# Patient Record
Sex: Male | Born: 1935 | Race: White | Hispanic: No | Marital: Single | Smoking: Never smoker
Health system: Southern US, Community
[De-identification: ages and names within clinical notes are randomized; demographics above are authoritative.]

## PROBLEM LIST (undated history)

## (undated) DIAGNOSIS — J984 Other disorders of lung: Secondary | ICD-10-CM

## (undated) DIAGNOSIS — T17908A Unspecified foreign body in respiratory tract, part unspecified causing other injury, initial encounter: Secondary | ICD-10-CM

## (undated) DIAGNOSIS — I272 Pulmonary hypertension, unspecified: Secondary | ICD-10-CM

## (undated) DIAGNOSIS — R569 Unspecified convulsions: Secondary | ICD-10-CM

## (undated) DIAGNOSIS — D649 Anemia, unspecified: Secondary | ICD-10-CM

## (undated) DIAGNOSIS — E870 Hyperosmolality and hypernatremia: Secondary | ICD-10-CM

## (undated) DIAGNOSIS — I509 Heart failure, unspecified: Secondary | ICD-10-CM

## (undated) DIAGNOSIS — W19XXXA Unspecified fall, initial encounter: Secondary | ICD-10-CM

## (undated) DIAGNOSIS — IMO0001 Reserved for inherently not codable concepts without codable children: Secondary | ICD-10-CM

## (undated) DIAGNOSIS — IMO0002 Reserved for concepts with insufficient information to code with codable children: Secondary | ICD-10-CM

## (undated) DIAGNOSIS — R339 Retention of urine, unspecified: Secondary | ICD-10-CM

## (undated) DIAGNOSIS — S27309A Unspecified injury of lung, unspecified, initial encounter: Secondary | ICD-10-CM

## (undated) DIAGNOSIS — J189 Pneumonia, unspecified organism: Secondary | ICD-10-CM

## (undated) HISTORY — DX: Reserved for inherently not codable concepts without codable children: IMO0001

## (undated) HISTORY — DX: Reserved for concepts with insufficient information to code with codable children: IMO0002

---

## 1997-12-31 ENCOUNTER — Encounter: Admission: RE | Admit: 1997-12-31 | Discharge: 1997-12-31 | Payer: Self-pay | Admitting: Family Medicine

## 1998-01-20 ENCOUNTER — Encounter: Admission: RE | Admit: 1998-01-20 | Discharge: 1998-01-20 | Payer: Self-pay | Admitting: Family Medicine

## 2000-08-16 ENCOUNTER — Encounter: Admission: RE | Admit: 2000-08-16 | Discharge: 2000-08-16 | Payer: Self-pay | Admitting: Family Medicine

## 2000-09-11 ENCOUNTER — Encounter: Admission: RE | Admit: 2000-09-11 | Discharge: 2000-09-11 | Payer: Self-pay | Admitting: Family Medicine

## 2000-10-09 ENCOUNTER — Encounter: Admission: RE | Admit: 2000-10-09 | Discharge: 2000-10-09 | Payer: Self-pay | Admitting: Family Medicine

## 2000-10-31 ENCOUNTER — Encounter: Admission: RE | Admit: 2000-10-31 | Discharge: 2000-10-31 | Payer: Self-pay | Admitting: Family Medicine

## 2000-11-07 ENCOUNTER — Encounter: Admission: RE | Admit: 2000-11-07 | Discharge: 2000-11-07 | Payer: Self-pay | Admitting: Family Medicine

## 2000-11-21 ENCOUNTER — Encounter: Admission: RE | Admit: 2000-11-21 | Discharge: 2000-11-21 | Payer: Self-pay | Admitting: Family Medicine

## 2000-11-22 ENCOUNTER — Encounter: Admission: RE | Admit: 2000-11-22 | Discharge: 2000-11-22 | Payer: Self-pay | Admitting: Family Medicine

## 2000-12-09 ENCOUNTER — Encounter: Admission: RE | Admit: 2000-12-09 | Discharge: 2000-12-09 | Payer: Self-pay | Admitting: Family Medicine

## 2000-12-27 ENCOUNTER — Encounter: Admission: RE | Admit: 2000-12-27 | Discharge: 2000-12-27 | Payer: Self-pay | Admitting: Family Medicine

## 2001-01-01 ENCOUNTER — Encounter: Admission: RE | Admit: 2001-01-01 | Discharge: 2001-01-01 | Payer: Self-pay | Admitting: Family Medicine

## 2001-08-25 ENCOUNTER — Encounter: Admission: RE | Admit: 2001-08-25 | Discharge: 2001-08-25 | Payer: Self-pay | Admitting: Sports Medicine

## 2001-11-05 ENCOUNTER — Encounter: Admission: RE | Admit: 2001-11-05 | Discharge: 2001-11-05 | Payer: Self-pay | Admitting: Family Medicine

## 2001-11-20 ENCOUNTER — Encounter: Admission: RE | Admit: 2001-11-20 | Discharge: 2001-11-20 | Payer: Self-pay | Admitting: Family Medicine

## 2001-12-04 ENCOUNTER — Encounter: Admission: RE | Admit: 2001-12-04 | Discharge: 2001-12-04 | Payer: Self-pay | Admitting: Family Medicine

## 2001-12-11 ENCOUNTER — Encounter: Admission: RE | Admit: 2001-12-11 | Discharge: 2001-12-11 | Payer: Self-pay | Admitting: Family Medicine

## 2002-01-12 ENCOUNTER — Encounter: Admission: RE | Admit: 2002-01-12 | Discharge: 2002-01-12 | Payer: Self-pay | Admitting: Family Medicine

## 2002-02-04 ENCOUNTER — Encounter: Admission: RE | Admit: 2002-02-04 | Discharge: 2002-02-04 | Payer: Self-pay | Admitting: Family Medicine

## 2002-03-09 ENCOUNTER — Encounter: Admission: RE | Admit: 2002-03-09 | Discharge: 2002-03-09 | Payer: Self-pay | Admitting: Family Medicine

## 2002-03-20 ENCOUNTER — Encounter: Admission: RE | Admit: 2002-03-20 | Discharge: 2002-03-20 | Payer: Self-pay | Admitting: Family Medicine

## 2002-04-29 ENCOUNTER — Encounter: Admission: RE | Admit: 2002-04-29 | Discharge: 2002-04-29 | Payer: Self-pay | Admitting: Family Medicine

## 2002-07-13 ENCOUNTER — Encounter: Admission: RE | Admit: 2002-07-13 | Discharge: 2002-07-13 | Payer: Self-pay | Admitting: Family Medicine

## 2003-02-10 ENCOUNTER — Encounter: Admission: RE | Admit: 2003-02-10 | Discharge: 2003-02-10 | Payer: Self-pay | Admitting: Sports Medicine

## 2003-04-26 ENCOUNTER — Encounter: Admission: RE | Admit: 2003-04-26 | Discharge: 2003-04-26 | Payer: Self-pay | Admitting: Family Medicine

## 2003-05-26 ENCOUNTER — Encounter: Admission: RE | Admit: 2003-05-26 | Discharge: 2003-05-26 | Payer: Self-pay | Admitting: Family Medicine

## 2003-06-09 ENCOUNTER — Encounter: Admission: RE | Admit: 2003-06-09 | Discharge: 2003-06-09 | Payer: Self-pay | Admitting: Family Medicine

## 2003-06-11 ENCOUNTER — Encounter: Admission: RE | Admit: 2003-06-11 | Discharge: 2003-06-11 | Payer: Self-pay | Admitting: Family Medicine

## 2003-06-24 ENCOUNTER — Encounter: Admission: RE | Admit: 2003-06-24 | Discharge: 2003-06-24 | Payer: Self-pay | Admitting: Family Medicine

## 2003-10-14 ENCOUNTER — Encounter: Admission: RE | Admit: 2003-10-14 | Discharge: 2003-10-14 | Payer: Self-pay | Admitting: Family Medicine

## 2003-10-14 ENCOUNTER — Encounter: Admission: RE | Admit: 2003-10-14 | Discharge: 2003-10-14 | Payer: Self-pay | Admitting: Sports Medicine

## 2003-11-17 ENCOUNTER — Ambulatory Visit: Payer: Self-pay | Admitting: Sports Medicine

## 2003-12-02 ENCOUNTER — Encounter: Admission: RE | Admit: 2003-12-02 | Discharge: 2003-12-29 | Payer: Self-pay | Admitting: Sports Medicine

## 2004-06-01 ENCOUNTER — Inpatient Hospital Stay (HOSPITAL_COMMUNITY): Admission: AD | Admit: 2004-06-01 | Discharge: 2004-06-09 | Payer: Self-pay | Admitting: Family Medicine

## 2004-06-01 ENCOUNTER — Ambulatory Visit: Payer: Self-pay | Admitting: Family Medicine

## 2004-06-02 ENCOUNTER — Encounter: Payer: Self-pay | Admitting: Cardiology

## 2004-06-02 ENCOUNTER — Ambulatory Visit: Payer: Self-pay | Admitting: Cardiology

## 2004-06-29 ENCOUNTER — Ambulatory Visit: Payer: Self-pay | Admitting: Family Medicine

## 2004-08-10 ENCOUNTER — Ambulatory Visit: Payer: Self-pay | Admitting: Family Medicine

## 2004-09-11 ENCOUNTER — Ambulatory Visit: Payer: Self-pay | Admitting: Family Medicine

## 2004-11-15 ENCOUNTER — Ambulatory Visit: Payer: Self-pay | Admitting: Family Medicine

## 2005-06-13 ENCOUNTER — Ambulatory Visit: Payer: Self-pay | Admitting: Family Medicine

## 2005-07-17 ENCOUNTER — Ambulatory Visit: Payer: Self-pay | Admitting: Sports Medicine

## 2006-02-28 ENCOUNTER — Encounter: Payer: Self-pay | Admitting: Family Medicine

## 2006-02-28 ENCOUNTER — Ambulatory Visit: Payer: Self-pay | Admitting: Sports Medicine

## 2006-02-28 LAB — CONVERTED CEMR LAB
ALT: 13 units/L (ref 0–53)
AST: 21 units/L (ref 0–37)
Alkaline Phosphatase: 89 units/L (ref 39–117)
BUN: 16 mg/dL (ref 6–23)
CO2: 26 meq/L (ref 19–32)
Calcium: 8.5 mg/dL (ref 8.4–10.5)
Chloride: 99 meq/L (ref 96–112)
Creatinine, Ser: 0.57 mg/dL (ref 0.40–1.50)
Potassium: 5.1 meq/L (ref 3.5–5.3)
Sodium: 133 meq/L — ABNORMAL LOW (ref 135–145)
Total Bilirubin: 0.3 mg/dL (ref 0.3–1.2)

## 2006-04-04 ENCOUNTER — Ambulatory Visit: Payer: Self-pay | Admitting: Sports Medicine

## 2006-04-18 DIAGNOSIS — I10 Essential (primary) hypertension: Secondary | ICD-10-CM | POA: Insufficient documentation

## 2006-04-18 DIAGNOSIS — D509 Iron deficiency anemia, unspecified: Secondary | ICD-10-CM | POA: Insufficient documentation

## 2006-04-18 DIAGNOSIS — N401 Enlarged prostate with lower urinary tract symptoms: Secondary | ICD-10-CM | POA: Insufficient documentation

## 2006-05-07 ENCOUNTER — Ambulatory Visit: Payer: Self-pay | Admitting: Family Medicine

## 2006-05-07 ENCOUNTER — Ambulatory Visit: Payer: Self-pay | Admitting: Pulmonary Disease

## 2006-05-07 ENCOUNTER — Inpatient Hospital Stay (HOSPITAL_COMMUNITY): Admission: EM | Admit: 2006-05-07 | Discharge: 2006-05-22 | Payer: Self-pay | Admitting: Emergency Medicine

## 2006-05-08 ENCOUNTER — Encounter: Payer: Self-pay | Admitting: Cardiovascular Disease

## 2006-10-04 ENCOUNTER — Telehealth (INDEPENDENT_AMBULATORY_CARE_PROVIDER_SITE_OTHER): Payer: Self-pay | Admitting: *Deleted

## 2006-10-14 ENCOUNTER — Telehealth: Payer: Self-pay | Admitting: *Deleted

## 2006-10-23 ENCOUNTER — Ambulatory Visit: Payer: Self-pay | Admitting: Family Medicine

## 2006-10-23 ENCOUNTER — Encounter (INDEPENDENT_AMBULATORY_CARE_PROVIDER_SITE_OTHER): Payer: Self-pay | Admitting: *Deleted

## 2006-10-23 DIAGNOSIS — E039 Hypothyroidism, unspecified: Secondary | ICD-10-CM | POA: Insufficient documentation

## 2006-10-23 DIAGNOSIS — M810 Age-related osteoporosis without current pathological fracture: Secondary | ICD-10-CM | POA: Insufficient documentation

## 2006-10-29 LAB — CONVERTED CEMR LAB
BUN: 22 mg/dL (ref 6–23)
Creatinine, Ser: 0.68 mg/dL (ref 0.40–1.50)
Glucose, Bld: 93 mg/dL (ref 70–99)
MCHC: 32 g/dL (ref 30.0–36.0)
MCV: 87.5 fL (ref 78.0–100.0)
Monocytes Relative: 8 % (ref 3–11)
Neutro Abs: 4 10*3/uL (ref 1.7–7.7)
TSH: 3.963 microintl units/mL (ref 0.350–5.50)
WBC: 6.4 10*3/uL (ref 4.0–10.5)

## 2006-11-01 ENCOUNTER — Encounter (INDEPENDENT_AMBULATORY_CARE_PROVIDER_SITE_OTHER): Payer: Self-pay | Admitting: *Deleted

## 2006-12-17 ENCOUNTER — Encounter (INDEPENDENT_AMBULATORY_CARE_PROVIDER_SITE_OTHER): Payer: Self-pay | Admitting: *Deleted

## 2007-01-07 ENCOUNTER — Ambulatory Visit: Payer: Self-pay | Admitting: Family Medicine

## 2007-01-07 ENCOUNTER — Encounter: Payer: Self-pay | Admitting: *Deleted

## 2007-01-07 ENCOUNTER — Encounter (INDEPENDENT_AMBULATORY_CARE_PROVIDER_SITE_OTHER): Payer: Self-pay | Admitting: *Deleted

## 2007-01-07 LAB — CONVERTED CEMR LAB
BUN: 15 mg/dL (ref 6–23)
Bilirubin Urine: NEGATIVE
Calcium: 8.6 mg/dL (ref 8.4–10.5)
Creatinine, Ser: 0.63 mg/dL (ref 0.40–1.50)
Eosinophils Absolute: 0.3 10*3/uL (ref 0.2–0.7)
Eosinophils Relative: 4 % (ref 0–5)
Glucose, Urine, Semiquant: NEGATIVE
Hemoglobin: 11.7 g/dL — ABNORMAL LOW (ref 13.0–17.0)
Lymphocytes Relative: 29 % (ref 12–46)
Lymphs Abs: 2.1 10*3/uL (ref 0.7–4.0)
MCHC: 31.5 g/dL (ref 30.0–36.0)
Monocytes Absolute: 0.5 10*3/uL (ref 0.1–1.0)
Neutro Abs: 4.3 10*3/uL (ref 1.7–7.7)
Neutrophils Relative %: 60 % (ref 43–77)
Nitrite: POSITIVE
Platelets: 264 10*3/uL (ref 150–400)
Protein, U semiquant: NEGATIVE
RDW: 14 % (ref 11.5–15.5)
Specific Gravity, Urine: 1.015
Urobilinogen, UA: 0.2

## 2007-01-13 ENCOUNTER — Encounter (INDEPENDENT_AMBULATORY_CARE_PROVIDER_SITE_OTHER): Payer: Self-pay | Admitting: *Deleted

## 2007-03-03 ENCOUNTER — Encounter (INDEPENDENT_AMBULATORY_CARE_PROVIDER_SITE_OTHER): Payer: Self-pay | Admitting: *Deleted

## 2007-03-03 ENCOUNTER — Ambulatory Visit: Payer: Self-pay | Admitting: Family Medicine

## 2007-03-03 LAB — CONVERTED CEMR LAB
Glucose, Urine, Semiquant: NEGATIVE
Ketones, urine, test strip: NEGATIVE
Specific Gravity, Urine: 1.025

## 2007-03-05 ENCOUNTER — Ambulatory Visit: Payer: Self-pay | Admitting: Family Medicine

## 2007-03-05 ENCOUNTER — Encounter (INDEPENDENT_AMBULATORY_CARE_PROVIDER_SITE_OTHER): Payer: Self-pay | Admitting: *Deleted

## 2007-03-05 LAB — CONVERTED CEMR LAB
CO2: 24 meq/L (ref 19–32)
Calcium: 8.4 mg/dL (ref 8.4–10.5)
Chloride: 102 meq/L (ref 96–112)
Potassium: 4.2 meq/L (ref 3.5–5.3)

## 2007-03-06 ENCOUNTER — Encounter (INDEPENDENT_AMBULATORY_CARE_PROVIDER_SITE_OTHER): Payer: Self-pay | Admitting: *Deleted

## 2007-03-31 ENCOUNTER — Ambulatory Visit: Payer: Self-pay | Admitting: Family Medicine

## 2007-03-31 ENCOUNTER — Encounter (INDEPENDENT_AMBULATORY_CARE_PROVIDER_SITE_OTHER): Payer: Self-pay | Admitting: *Deleted

## 2007-03-31 LAB — CONVERTED CEMR LAB
Bilirubin Urine: NEGATIVE
Glucose, Urine, Semiquant: NEGATIVE
Ketones, urine, test strip: NEGATIVE
Nitrite: NEGATIVE
Protein, U semiquant: NEGATIVE

## 2007-04-01 LAB — CONVERTED CEMR LAB
BUN: 16 mg/dL (ref 6–23)
Calcium: 8.5 mg/dL (ref 8.4–10.5)
Glucose, Bld: 137 mg/dL — ABNORMAL HIGH (ref 70–99)
Potassium: 4.4 meq/L (ref 3.5–5.3)

## 2007-04-04 ENCOUNTER — Encounter (INDEPENDENT_AMBULATORY_CARE_PROVIDER_SITE_OTHER): Payer: Self-pay | Admitting: *Deleted

## 2007-06-19 ENCOUNTER — Telehealth (INDEPENDENT_AMBULATORY_CARE_PROVIDER_SITE_OTHER): Payer: Self-pay | Admitting: *Deleted

## 2007-07-16 ENCOUNTER — Encounter (INDEPENDENT_AMBULATORY_CARE_PROVIDER_SITE_OTHER): Payer: Self-pay | Admitting: *Deleted

## 2007-09-18 ENCOUNTER — Encounter: Payer: Self-pay | Admitting: Family Medicine

## 2007-10-24 ENCOUNTER — Encounter: Payer: Self-pay | Admitting: Family Medicine

## 2007-11-13 ENCOUNTER — Encounter: Payer: Self-pay | Admitting: Family Medicine

## 2007-12-01 ENCOUNTER — Encounter: Payer: Self-pay | Admitting: Family Medicine

## 2007-12-05 ENCOUNTER — Encounter: Payer: Self-pay | Admitting: Family Medicine

## 2007-12-15 ENCOUNTER — Encounter: Payer: Self-pay | Admitting: Family Medicine

## 2007-12-22 ENCOUNTER — Encounter: Payer: Self-pay | Admitting: Family Medicine

## 2007-12-31 ENCOUNTER — Telehealth: Payer: Self-pay | Admitting: Family Medicine

## 2007-12-31 ENCOUNTER — Ambulatory Visit: Payer: Self-pay | Admitting: Family Medicine

## 2008-01-23 ENCOUNTER — Encounter: Payer: Self-pay | Admitting: Family Medicine

## 2008-02-03 ENCOUNTER — Encounter: Payer: Self-pay | Admitting: Family Medicine

## 2008-02-17 ENCOUNTER — Telehealth (INDEPENDENT_AMBULATORY_CARE_PROVIDER_SITE_OTHER): Payer: Self-pay | Admitting: *Deleted

## 2009-04-08 ENCOUNTER — Encounter: Payer: Self-pay | Admitting: Family Medicine

## 2010-03-23 NOTE — Miscellaneous (Signed)
Summary: wants x ray  Clinical Lists Changes William Baker, the rn with [facility] states he has chest congestion. sats are 93% on room air. they have 5 people in hospital for URI or pna. wants an order for Xray. her cell is [phone]. to pcp.[name]  April 08, 2009 9:56 AM  Called back to number given above.  Talked with William Baker who states that William Baker is doing better--afebrile, sat 94%.  Gave verbal order for CXR.  Advised ED or call doctor on call if his status changes.  [name] MD  April 08, 2009 5:19 PM    Appended Document:  TO staff:  Please call [facility] [phone] to let them know that William Baker needs to come see me before I will sign their care plan.  He has missed his last follow-up appointment, and I am not going to sign a care plan for a patient that doesn't come to see me.  I see he has an appointment later in November.  I will sign during that appointment if he shows up.

## 2010-07-07 NOTE — Consult Note (Signed)
William Baker, William Baker                ACCOUNT NO.:  [id number]   MEDICAL RECORD NO.:  [id number]          PATIENT TYPE:  INP   LOCATION:  5530                         FACILITY:  [facility]   PHYSICIAN:  Sigmund I. Patsi Sears, M.D.DATE OF BIRTH:  26-Jul-1925   DATE OF CONSULTATION:  05/20/2006  DATE OF DISCHARGE:                                 CONSULTATION   SUBJECTIVE:  This 77 year old single male, was admitted on May 07, 2006 with:  1. Altered mental status.  2. History of benign prostatic hypertrophy.  3. History of transitional cell carcinoma of the bladder.  4. History of hypertension.   The patient had catheterization and urology consult, per Dr. Logan Bores, on  May 07, 2006, with a diagnosis of urethral stricture.  A 10-French  catheter was placed and left to drainage.  The patient had removal of  catheter at 12:30 this afternoon and has been unable to void since then.  Urology is reconsulted to place a catheter.   PAST MEDICAL HISTORY:  Significant for:  1. Hypertension.  2. Benign prostatic hypertrophy.  3. Prostatitis.  4. Osteopenia.  5. Anemia.   MEDICATIONS:  1. Altace 5 mg a day.  2. Calcium carbonate 1 tablet with meals.  3. Cardura 8 mg h.s.  4. Fosamax 70 mg per week.  5. Proscar 5 mg per day.  6. Multivitamins.  7. Senokot p.r.n.   FAMILY HISTORY:  1. Diabetes.  2. Coronary artery disease.   SOCIAL HISTORY:  Patient lives alone.  His brother lives nearby.  Alcohol is none.  Tobacco is none.  Drug abuse none.   REVIEW OF SYSTEMS:  CONSTITUTIONAL:  Significant only for recent  confusion.  The patient denies all other review of systems, but is a  poor historian.   PHYSICAL EXAMINATION:  GENERAL:  Tonight, shows a thin, elderly male,  talkative, conversive, but somewhat confused.  VITAL SIGNS:  Temperature is 97.8.  Blood pressure 154/73.  Pulse of 70.  Respiratory rate 16.  O2 saturation 94%.  NECK:  Supple.  Nontender.  CHEST:  Clear to P&A.  ABDOMEN:   Soft scaphoid, plus bowel sounds without organomegaly or  masses.  GENITOURINARY:  Shows normal penis, with atrophied foreskin, which is  phimotic.  I am unable to peel the foreskin over the glands.  The  urethral meatus is identified.  The scrotum is normal.  Testicles are 3  x 3 cm and nontender.  RECTAL EXAMINATION:  Not indicated on this examination.  EXTREMITIES:  No cyanosis or edema.  PSYCHOLOGIC:  Shows disorientation.  Patient is a very poor historian.   PROCEDURE:  The urethral meatus and submeatus is dilated.  The flexible  cystoscope is placed within the urethra.  Urethroscopy, otherwise, is  normal and I do not see a more proximal urethral stricture.  The scop is  placed in the bladder.  No gross evidence of bladder tumor is  identified.  Guidewire is passed into the bladder and the scope removed.  A 16-French 5 mL counsel catheter is placed over the wire into the  bladder.  Approximately 100  mL of clear, straw colored fluid is  obtained.  The patient tolerated the procedure well.  It is recommended  that he be covered with antibiotics.  Patient should have follow up with  Dr. Logan Bores.  Would advise leaving the Foley catheter in until then.      Sigmund I. Patsi Sears, M.D.  Electronically Signed     SIT/MEDQ  D:  05/20/2006  T:  05/21/2006  Job:  [id number]   cc:   Dr. Logan Bores

## 2010-07-07 NOTE — H&P (Signed)
William Baker, William Baker                ACCOUNT NO.:  [id number]   MEDICAL RECORD NO.:  [id number]          PATIENT TYPE:  INP   LOCATION:  6729                         FACILITY:  [facility]   PHYSICIAN:  Franchot Mimes, MD      DATE OF BIRTH:  Mar 11, 1925   DATE OF ADMISSION:  06/01/2004  DATE OF DISCHARGE:                                HISTORY & PHYSICAL   CHIEF COMPLAINT:  Bilateral lower extremity cellulitis.   HISTORY OF PRESENT ILLNESS:  William Baker is a 76 year old male with a past  medical history that will be outlined below, who presents to clinic for the  first time in 7 months today with his brother, complaining of significant  lower extremity edema.  Apparently, this has been going on for several  months.  He has had very severe blistering and ulceration that has occurred  secondary to this stasis edema and has had very tender legs with warmth and  erythema.  He reports he has been self-treating this with antibiotic  ointment but has had minimal results.  William Baker is not very concerned about  this at this time and would like to just continue with the antibiotic  ointment; however, his brother is very obviously concerned about not only  the current condition of his legs but also his overall well being and mental  status.  His brother reports that he constantly stands and will not sit, and  this contributes to his lower extremity edema.  He also reports that he does  not bathe very well and simply uses a wet rag occasionally on his legs  today.   REVIEW OF SYSTEMS:  He denies any fevers, chills, significant weight gain or  weight loss.  He denies any chest pain or shortness of breath.  He has had  no abdominal pain, nausea, vomiting, constipation or diarrhea.  According to  his brother, he has had some increased disorientation and forgetfulness.  He  denies any hematuria or dysuria.  Remainder of review of systems is  negative.   PAST MEDICAL HISTORY:  1.  Significant for BPH.  2.   Hypertension.  3.  Osteoporosis.   FAMILY HISTORY:  The patient has diabetes and coronary artery disease in his  family.   SOCIAL HISTORY:  He lives alone.  His brother is his only family that is  involved with him.  He enjoys gardening.  He does not have any history of  alcohol, tobacco, or drugs.  He has never been married and has no children.   PAST SURGICAL HISTORY:  None.   CURRENT MEDICATIONS:  1.  Altace 5 mg daily.  2.  Calcium carbonate 2-3 tabs daily with meals.  3.  Cardura 4 mg nightly.  4.  Fosamax 70 mg weekly.  5.  Multivitamin daily.  6.  Proscar daily.  7.  Sorbitol p.r.n. at night for constipation.   I do not believe he takes any of these medicines with regularity, as he has  always been noncompliant and resistant to taking medications.   PHYSICAL EXAMINATION:  VITAL SIGNS:  Temperature  98.2, blood pressure  117/54, heart rate 88, weight 123 pounds.  GENERAL:  The patient is very kyphotic.  He speaks in mumbling sentences,  but he is awake, alert, and oriented x 3 and has no distress.  His judgment  and insight are poor.  HEENT:  Normal conjunctivae and sclerae.  Normal oropharynx, teeth, and  gums.  NECK:  No masses, no thyroid enlargement, no lymphadenopathy.  CHEST:  Clear to auscultation bilaterally with normal effort.  CARDIOVASCULAR:  There is a grade 2/6 systolic murmur.  The heart appears  normal in size.  ABDOMEN:  Soft, nontender, nondistended.  No masses, no bruit.  Positive  bowel sounds.  SKIN:  There is some right facial forehead erythema concerning for abrasion  versus possible carcinoma.  EXTREMITIES:  The lower extremities are severely swollen with +4 pitting  edema, and they are very erythematous and warm.  There are multiple very  large ulcerated blisters with purulent material in various stages of healing  on both extremities.  The left lower extremity has a very large,  approximately 5 cm blister that is quite tense and full of  weeping fluid.   LABORATORY DATA:  A wound culture has been taken.  CBC with differential and  BMET are pending.   IMPRESSION:  A 77 year old male with bilateral lower extremity cellulitis,  most likely from chronic venous stasis.   PROBLEM LIST:  1.  Cellulitis.  I discussed the case with Dr. Darrick Penna.  Given the chronicity      of this problem, our suspicion for methicillin-resistant Staphylococcus      aureus is quite low at this time, and this certainly is not an acute      eruption.  Most likely the cellulitis is from chronic venous stasis      which is not helped by the fact he refuses to sit and does not use any      compression hose which have been prescribed in the past.  We will start      Ancef and await would culture results.  Plan to keep the legs elevated      if he will comply with being in bed and order a wound care consult for      the morning to help aid in healing.   1.  Mental status.  I suspect there is some underlying dementia that may be      worsening as well, as he has very poor insight and mentation.  His      competency for self care may be an issue.  A Mini Mental Status Exam      would be an order, and care management consult will be placed for      possible nursing home placement versus home nursing.   1.  Murmur.  We will obtain a 2 D echocardiogram to evaluate for possible      right-sided congestive heart failure.   1.  Kyphosis/osteoporosis.  Continue his Fosamax and calcium.  Took a      calcium and consider a phosphorus or PTH if indicated with      hypercalcemia.   1.  Hypertension.  This apparently is controlled despite the fact he      probably does not take his Altace.  We will continue this medicine while      in the hospital.      TV/MEDQ  D:  06/01/2004  T:  06/01/2004  Job:  [id number]

## 2010-07-07 NOTE — Discharge Summary (Signed)
William Baker, William Baker                ACCOUNT NO.:  [id number]   MEDICAL RECORD NO.:  [id number]          PATIENT TYPE:  INP   LOCATION:  6729                         FACILITY:  [facility]   PHYSICIAN:  Sharin Grave, MD  DATE OF BIRTH:  1926/02/15   DATE OF ADMISSION:  06/01/2004  DATE OF DISCHARGE:  06/09/2004                                 DISCHARGE SUMMARY   DISCHARGE DIAGNOSES:  1.  Lower extremity edema/venous stasis ulcers.  2.  Hypertension.  3.  Benign prostatic hypertrophy.  4.  Anemia.  5.  Social.   DISCHARGE MEDICATIONS:  1.  Altace 5 mg p.o. daily.  2.  Calcium carbonate 2-3 tablets daily.  3.  Cardura 4 mg q.h.s.  4.  Fosamax 70 mg once a week.  5.  Multivitamins daily.  6.  Proscar 5 mg p.o. daily.  7.  Iron in the form of ferrous sulfate 325 mg b.i.d.  8.  Senokot 8.6 tablets 2 tablets b.i.d.  9.  Dulcolax 5 mg one or two tablets daily p.r.n. for constipation.  10. Tramadol 50 mg q.4 h. p.r.n. for pain.   DISCHARGE INSTRUCTIONS:  1.  Level of activity.  The patient is to continue physical      therapy/occupational therapy assessment and management.  2.  Wound care.  Information has been [facility]ed to the [facility], and      they will call the patient back for an appointment.  The patient is      going to have Unna boot dressing on both of his lower extremities, and      he is to continue with this treatment and follow up at the [facility].  3.  Follow-up appointment with Franchot Mimes, M.D. at the family practice      center has been performed prior to discharge and is scheduled for      Friday, June 16, 2004, at 3 p.m.   HOSPITAL COURSE:  A 77 year old male with past medical history significant  for benign prostatic hypertrophy, hypertension, and osteoporosis, who was  admitted for lower extremity edema and ulcers, concern for cellulitis.  On  admission, the patient's lower extremities were swollen with 4+ pitting  edema.  They were  erythematous and warm with multiple large ulcerated  blisters with purulent material in various stages of healing in both  extremities.  The rest of the exam was just significant for a subtle  systolic ejection murmur, and vitals on admission and during the length of  stay were within normal limits, and the patient denied a history of fever or  chills and remained afebrile during the length of admission.   ADMISSION LABORATORY DATA:  Parekh blood cell count 6.9, hemoglobin 9.3,  hematocrit 26.9, platelets 331,000, neutrophilic count 71, lymphocytes 22,  monocytes 6.  The BMP had a sodium of 129, potassium 4.1, chloride 98, CO2  26, glucose 140, BUN 18, creatinine 0.8, glucose 140, and calcium 7.9.   BRIEF HOSPITAL COURSE BY PROBLEMS:  Problem 1.  Lower extremity edema versus  venous stasis.  Initially, the patient was started on vancomycin with  concern for MRSA.  Wound cultures are negative as per final reports, and  also blood cultures are negative per final reports.  The patient underwent  debridement of crustings above the ulcers, and Unna boots were placed  bilaterally.  Wound Care was consulted and followed the patient during the  length of admission, and antibiotics were discontinued after two days.  The  patient remained afebrile, and [name] blood cell count remained low.  It was  believed that this is an episode of venous stasis and not really a case of  cellulitis or infection.  The patient is to remain with the lower extremity  compressive dressings and is to follow up at the [facility] for  further management.   Problem 2.  Anemia.  The patient's hemoglobin on admission was 9.3.  It  remained stable throughout the length of admission.  Workup of the anemia  revealed an iron level of 33 with a total iron binding capacity of 247, and  a ferritin level of 23.  Erythrocyte sedimentation rate of this patient is  38.  Blood smear shows polychromasia, and MCV is within normal  limits.  Hemoccult is negative.  There is not enough information to conclude 100%  that this is an iron deficient anemia, so a trial of treatment with iron  p.o. is going to be started on discharge, 325 mg p.o. b.i.d., and this issue  is to be followed as an outpatient. Though Hemoccult test is negative, the  patient might require low GI studies in the future.   Problem 3.  Hyponatremia.  The patient has a history of hyponatremia.  Last  BMP done on admission was on April 19 and showed a sodium level of 134.  There was no further workup performed around this issue during admission.   Problem 4.  Hypertension.  This remained stable during the length of  admission.   Problem 5.  Benign prostatic hypertrophy remained stable on home regimen.   Problem 6.  Constipation.  The patient had several bowel movements during  admission, at least every other day, but he complained of having to strain  during bowel movements.  We started him on Senokot 2 tablets p.o. b.i.d.,  and Dulcolax was added to his regimen.  He is going to be on iron, and he  might experience increasing constipation, and likely would need to continue  on Senokot and Dulcolax.  Further colonoscopy or sigmoidoscopy might be  required in the future.   Problem 7.  Social.  Mr. Mehringer lives alone, and PT/OT evaluation was done  during admission.  Occupational therapy actually recommended long-term NPH  secondary to overall weakness, pain, restlessness in both legs, decreased  endurance, and inability to complete daily tasks.  The patient concluded  that it was not safe to discharge the patient home since he is not able to  care for himself and perform home management tasks.  The patient refused  long-term place search and was agreeable to go to the [facility] unit before going  home.  There was not a bed available in the [facility] unit, and the patient  agreed to go to the short-term place to regain endurance before going home. Further  physiotherapy and occupational therapy evaluation might be needed in  order to assess if it is safe for this patient to go back home after a short-  term stay in SNF.   Problem 8.  Disposition.  The patient is going to be discharged to a short-  term skilled nursing facility in a stable and improved condition.      AM/MEDQ  D:  06/09/2004  T:  06/09/2004  Job:  [id number]   cc:   Franchot Mimes, MD  Fax: [phone]

## 2010-07-07 NOTE — Discharge Summary (Signed)
NAMEHAVEN, FOSS                ACCOUNT NO.:  [id number]   MEDICAL RECORD NO.:  [id number]          PATIENT TYPE:  INP   LOCATION:  5530                         FACILITY:  [facility]   PHYSICIAN:  Alanda Amass, M.D.   DATE OF BIRTH:  03-12-1925   DATE OF ADMISSION:  05/07/2006  DATE OF DISCHARGE:  05/22/2006                               DISCHARGE SUMMARY   ADDENDUM:   MEDICATION ADDITION:  Includes:  1. Albuterol is 2.5 mg nebs q.4 hours p.r.n. instead of scheduled.  2. Patient was also restarted on Flomax 0.4 mg daily.  3. His prednisone is now discontinued since he received his last dose      today.  4. He is not receiving Proscar.  5. He is receiving Bactroban ointment with daily dressing changes to      his left elbow.   ADDITIONAL FOLLOWUP THAT WAS NOT REPORTED IN THE PREVIOUS DISCHARGE  SUMMARY:  He is to follow up with Dr. Logan Bores, the urologist, in one to  two weeks, he will go home with his Foley catheter in and he is also to  have physical therapy and occupational therapy in the SNF.   Urinary retention:  Patient is still retaining urine.  He will go home  with the Foley catheter to the SNF.  We will need to increase his  Cardura slowly; he is currently at 2 mg daily.  The goal is to stop the  Flomax and discontinue the catheter eventually.  We will probably want  him on his home dose of Cardura 8 mg and finasteride 5 mg eventually  once these are titrated up.   His labs at discharge include BMP:  Sodium of 135, potassium 4.5,  chloride 100, bicarb 28, BUN 34, creatinine 0.54, glucose 116.  CBC:  Rackley blood cell count 9.4, hemoglobin 9.7, hematocrit 28.6 and  platelets 759.           ______________________________  Alanda Amass, M.D.     JH/MEDQ  D:  05/22/2006  T:  05/22/2006  Job:  [id number]

## 2010-07-07 NOTE — Discharge Summary (Signed)
William Baker                ACCOUNT NO.:  [id number]   MEDICAL RECORD NO.:  [id number]          PATIENT TYPE:  INP   LOCATION:  5530                         FACILITY:  [facility]   PHYSICIAN:  William Baker, M.D.DATE OF BIRTH:  Sep 28, 1925   DATE OF ADMISSION:  05/07/2006  DATE OF DISCHARGE:  05/21/2006                               DISCHARGE SUMMARY   DISCHARGE DIAGNOSES:  1. Fall.  2. Hyponatremia.  3. Probable seizure secondary to #2.  4. Acute lung injury/aspiration pneumonitis.  5. Anemia.  6. Urinary retention due to benign prostatic hypertrophy.  7. Right-sided congestive heart failure.  8. Pulmonary hypertension.  9. Hypokalemia.  10.Constipation.  11.Malnutrition.   CONSULTS:  1. Urology.  2. Pulmonary.  3. Cardiology.  4. Nutrition.   PROCEDURE:  1. Echocardiogram on March 19 showed left ventricular systolic      function was normal.  Mild LVH, mild aortic regurgitation, mild      mitral regurgitation, mildly dilated left atrium, moderate      tricuspid regurgitation, elevated right ventricular systolic      pressure to 62 and elevated pulmonary artery systolic pressure at      62 as well.  2. Multiple chest x-rays during the time of his respiratory distress.      Showed prominent acute pulmonary edema with cardiomegaly and      bilateral pleural effusion, pneumonia, aspiration, hemorrhage, all      within the differential.  3. Abdominal and pelvic CT showed a fairly large hiatal hernia,      constipation with a prominent amount of stool.  4. Head CT without contrast, which showed no acute intracranial      abnormality.   HOSPITAL COURSE:  1. Altered mental status:  This is felt to be due to hyponatremia and      a likely seizure as a result of hyponatremia, as his CK was      markedly elevated at 4390.  He oriented only to place and name on      admission.  A CT of the head was without any acute process.      Troponins were elevated, but cardiology  felt this was due to muscle      breakdown and not a cardiac process.  A UA was negative for      infection.  Chest x-ray was without infiltrate.  He was covered      with vancomycin and Zosyn initially for possible infectious cause      until cultures returned.  Blood cultures grew micrococcus and very      few GNRs that could not be cultured.  He completed a 10 day course      of Zosyn.  He remained afebrile throughout the stay but was noted      to have a temperature of 92.1 on admission that resolved within 24      hours, being on a bear hugger.  Abdominal CT was done for rigidity      on admission, but he did not have any ruptured viscus, and there  was no other acute process on that.  Urine drug screen, alcohol      level, aspirin, and Tylenol levels were negative.  He was not      tachypneic or tachycardic on admission and did not have oxygen      requirements to suggest a pulmonary embolus.  He failed a mini      mental status exam on admission with mild-to-moderate dementia and      admitted to some depressive symptoms, so he was started on both      Aricept 5 mg and Celexa 10 mg.  His RPR was nonreactive.  His TSH      was elevated at between 8-10 with a mild decrease in his free T3      and free T4, but it was suggested to recheck this in one month as      an outpatient due to patient being in the hospital with an acute      process.  He is alert and oriented x4 and appropriately answering      questions on discharge after the correction of a sodium.   1. Hyponatremia, down to 11.4 on admission.  This was felt to have led      to a seizure, as noted above.  He was given gentle hydration with      normal saline due to right heart failure.  He had probable      pulmonary edema on chest x-ray on March 22, which led to initiation      of Lasix as well, which also brought up his sodium.  Fluids were      discontinued when his sodium was within the normal range, and he      was  taking p.o.   1. Anemia, questionably due to chronic disease.  Hemoglobin was 10.6      on admission, down to 8.3 at the lowest, although he did not      require transfusion.  His MCV was 83.  His stool was heme negative.      Ferritin was normal.  B12 was slightly increased.  Sed rate was 5      initially when these labs were checked.  His RBC folate was normal.      B12 was slightly elevated.  Haptoglobin and LDH were both elevated.      His retic count was normal.   1. Acute respiratory distress:  Thought initially to be due to fluid      overload in correcting his hyponatremia.  Adequate diuresis.  He      continued to require nonrebreather after 6 liters of fluid out.  He      did have a primary respiratory acidosis on blood gas.  Pulmonary      was consulted and thought this was an acute lung injury with      aspiration pneumonitis, as his sed rate was up to 92 from 5 on      admission.  Continued the low dose of Lasix, as his BNP was 682 at      max and he had some edema on the chest x-ray but also added      steroids IV.  That was transitioned over to p.o. with a taper over      one week.  His prednisone will be discontinued after his dose on      April 2.  He was on room air at discharge, satting in the upper  90s.  We will continue his albuterol nebs on discharge.   1. Urinary retention:  Patient was followed by [facility] and      appreciated their care throughout the patient's stay.  Foley was      initially placed with his altered mental status. We attempted a      void trial on March 31, which he failed.  Foley was replaced by      urology.  Will attempt a trial again, only per their      recommendation.  It seems as though he needs to increase his      strength first and needs to get back on his home regimen, slowly      titrate up, due to him recently having a fall, which most likely     was due to seizure but he may have also been orthostatic.  He was       started on Flomax 0.5 mg and a low dose Cardura at 1 mg.  It was      noted at home that he was normally on Cardura 8 mg and finasteride      5 mg.  This will be followed as an outpatient.  We will try to      titrate up if he continues to remain in the hospital, to      finasteride 5 mg and slowly increase his Cardura.  He had hematuria      for two days earlier on in the admission that cleared, so it is not      felt that he needed a cystoscopy, as initially presumed.  Heparin      was discontinued when he began to have his hematuria, as it was      initially started by [place] and cardiology for his increased troponins.   1. Abdominal rigidity:  Felt this is secondary to presumed seizure.      Lipase is normal.  There is nothing acute on abdominal and pelvic      CT.  AST was elevated to 162, but this is not uncommon in seizure.      The liver panel trended down.   1. Hypokalemia due to Lasix.  He was repleted as needed.   1. Constipation:  Started Senokot-S with good results.   1. Malnutrition:  Pre-albumin was 7.4.  Nutrition was consulted.  The      patient started Ensure b.i.d. with a multivitamin.   1. Deconditioning:  To get PT/OT at skilled nursing facility.   1. Patient is full code.   DISCHARGE MEDICATIONS/INSTRUCTIONS:  1. Tylenol 1000 mg t.i.d.  2. Albuterol 2.5 mg nebs q.i.d.  3. Aspirin 81 mg daily.  4. Aricept 5 mg daily.  5. Cardura 2 mg daily.  6. Ensure 1 can b.i.d.  7. Lasix 40 mg daily.  8. Nasonex 2 sprays each nostril daily.  9. Multivitamin daily.  10.Paxil 10 mg daily.  11.Prednisone 40 mg through April 2.  12.Senokot-S 2 tabs p.r.n. constipation, up to b.i.d. for      constipation.  13.Proscar 5 mg daily.  14.Fosamax 70 mg weekly.  15.Calcium carbonate 1 tablet with meals.  16.Patient's Altace was held.  17.Flomax was discontinued in the hospital.   DISCHARGE CONDITION:  Good, stable.   FOLLOW UP:  Dr. Gilmore Laroche at [facility] pending  actual date of discharge.  Would like him to follow up in 1-2 weeks or  shortly after he  is discharged from skilled nursing facility.   ISSUES FOR FOLLOWUP:  1. Check TSH in one month.  2. Titrate Celexa and Aricept.  3. Increase Cardura slowly.  4. Check basic metabolic panel to monitor potassium on low dose Lasix.  5. Insure that if he return to home, that his strength is up enough      that he is safe in that environment.     ______________________________  William Baker, M.D.    ______________________________  William Baker, M.D.    SH/MEDQ  D:  05/21/2006  T:  05/21/2006  Job:  [id number]   cc:   [name] T. Pamalee Leyden, MD  Vesta Mixer, M.D.

## 2010-07-07 NOTE — H&P (Signed)
William Baker, William Baker                ACCOUNT NO.:  [id number]   MEDICAL RECORD NO.:  [id number]          PATIENT TYPE:  INP   LOCATION:  2631                         FACILITY:  [facility]   PHYSICIAN:  Leighton Roach McDiarmid, M.D.DATE OF BIRTH:  [birth year]   DATE OF ADMISSION:  05/07/2006  DATE OF DISCHARGE:                              HISTORY & PHYSICAL   CHIEF COMPLAINT:  Altered mental status.   PRIMARY CARE PHYSICIAN:  Dr. Gilmore Laroche, [facility].   HISTORY OF PRESENT ILLNESS:  William Baker is a 77 year old gentleman who  presented to the emergency department with EMS secondary to altered  mental status.  The patient was found down by EMS.  The patient was last  at baseline per his brother 3 days prior to admission.  Per the  patient's brother, the patient describes an episode in which he had  fallen while attempting to ambulate to the restroom.  At that time per  his brother, he denied any chest pain or shortness of breath.  However,  the next day, [employer] attempted to come to the patient's house,  and they were unable to contact him.  Given their concerns, they  contacted the brother who called EMS.  Upon arrival to the emergency  department, the patient was in atrial fibrillation with RPR with a rate  of 101.  There is no documentation as to whether agents were given to  stop this, but his next EKG 3 minutes later spontaneously resolved and  was found to be in sinus bradycardia at 54 beats per minute.  The  patient was also found to be hyponatremia at 114 and hypothermic with a  rectal temperature of 92.1.  He had rigid abdomen and no bowel sounds  present.  History is limited secondary to altered mental status.   PAST MEDICAL HISTORY:  1. Significant for hypertension.  2. BPH.  3. Iron-deficiency anemia.  4. History of prostatitis.  5. History of osteopenia.   Uncertain of the patient's medications at this time.  Per our records,  the patient was supposed to  be taking:  1. Altace 5 mg daily.  2. Calcium carbonate 1 tablet with meals.  3. Cardura 8 mg every night. .  4. Fosamax 70 mg weekly.  5. Multivitamin.  6. Proscar 5 mg daily.  7. Senokot S as needed.   There is question of the patient being on hydrochlorothiazide in the  past which caused hyponatremia.   FAMILY HISTORY:  Is significant for diabetes and coronary artery disease  per our records.   SOCIAL HISTORY:  The patient lives alone and has a brother who lives  nearby.  There is no history of alcohol, cigarettes or drug abuse.   REVIEW OF SYSTEMS:  Is negative except for within the history of present  illness.   Temperature 92.1 rectally, improved to 97.  The patient on bear hugger.  Blood pressure 136/70, heart rate of 53, respirations 18, 100% on room  air.  In general, the patient is oriented to person only.  Pupils are equal,  round, reactive  to light and accommodation from 3 to 2 bilaterally.  PULMONARY:  Clear to auscultation bilaterally.  ABDOMEN:  Is firm with decreased bound sounds.  Emesis is provoked with  deep epigastric palpation.  CARDIAC EXAM:  Bradycardic in the 50s and 60s with no murmurs or bruits  heard.  NEUROLOGICAL EXAM:  2+ deep tendon reflexes throughout.  RECTAL EXAM:  There is stool in the vault and could not palpate the  patient's prostate.  The patient is Hemoccult negative.   Lab work at this time:  Borman blood cell count 7.9, hemoglobin 10.2,  platelets 243, 90% segs with an MCV of 85.  The patient's recheck sodium  is 115, potassium 3.9, chloride 86, bicarb of 21, BUN of 17, creatinine  0.3 and glucose of 73.  The patient's anion gap is 8.  PT is 14.6, INR  1.1, blood cultures and urine cultures are pending.  Initial EKG at 1451  showed atrial fibrillation with RVR with a rate of 101.  EKG at 1503  showed sinus bradycardia at 54 beats per minute with a PR of 175 and a  prolonged QTC at 458.  Urinalysis is pending.  The patient's MB 191,   troponin I 0.49.  CK greater than 4300; 4.4 is the relative index.   ASSESSMENT/PLAN:  1. This is a 77 year old with altered mental status.  Differential      diagnosis includes stroke, seizure infection, meningitis,      intracranial hemorrhage, cardiac event, endocrine hypothyroidism or      hyperthyroidism, sepsis, toxin overdose, renal including uremia or      hyponatremia and hypernatremia.  We will obtain blood and urine      cultures.  CT of the head, abdomen and pelvis is pending for      history of trauma, and given his rigid abdominal exam, we will      place the patient on broad-spectrum antibiotics to also cover for      sepsis, vancomycin and Zosyn.  We will cycle his cardiac enzymes x3      given that his initial atrial fibrillation with rapid ventricular      response and then sinus bradycardia could represent a cardiac      event.  This is possibly a non-ST elevation myocardial infarction      given the patient's course.  We will obtain an alcohol, ammonia,      salicylate, Tylenol, urine drug screen for detoxication and add on      hepatic and lipase for intra-abdominal process.  If no improvement      in the patient's status and CT scan of the head is negative,      consider placing the patient on Lovenox full dose for pulmonary      embolism.  2. For hyponatremia, this likely represents hypovolemic hyponatremia.      The patient was given normal saline 2 liters in the emergency      department and is now running normal saline at 125 an hour.  We      will check another BMET in 3-4 hours to correct his sodium no      faster than 0.4 mEq/L per hour.  If there is no improvement,      consider urine sodium and urine osmolality and chest x-ray to rule      out syndrome of inappropriate antidiuretic hormone secretion with      an infiltrate.  3. For bradycardia, we will cycle the patient's  cardiac enzymes, place     him on telemetry bed and obtain a TSH.  4. For his  prolonged QTC of 458, we will follow his electrolytes and      add on a magnesium, phosphorus and watch his potassium.  5. For hypothermia, this is a concern for sepsis or adrenal      insufficiency.  We will add on a random cortisol level and continue      the patient on a bear hugger and start broad-spectrum antibiotics.  6. For normocytic anemia, patient's hemoglobin is stable at this time.      Hemoccult negative.  We will continue to follow.  7. For benign prostatic hypertrophy, nurses in the emergency      department were unable to place a Foley catheter.  Post-void      residual is pending at this time.  The patient's primary urologist      is Dr. Logan Bores.  We will call Dr. Logan Bores to place a Foley catheter      given that this may be related to his underlying BPH to evaluate      for obstruction.  Can restart the patient's Flomax and Cardura once      his altered mental status and hyponatremia      have resolved.  8. For prophylaxis, place the patient on protein-pump inhibitor and      SCDs until his CT is clear.   DISPOSITION:  Per resolution of his altered mental status.      Alanson Puls, M.D.    ______________________________  Leighton Roach McDiarmid, M.D.    MR/MEDQ  D:  05/08/2006  T:  05/08/2006  Job:  [id number]

## 2013-12-16 ENCOUNTER — Emergency Department (HOSPITAL_COMMUNITY): Payer: Medicare HMO

## 2013-12-16 ENCOUNTER — Encounter (HOSPITAL_COMMUNITY): Payer: Self-pay | Admitting: Emergency Medicine

## 2013-12-16 ENCOUNTER — Inpatient Hospital Stay (HOSPITAL_COMMUNITY)
Admission: EM | Admit: 2013-12-16 | Discharge: 2013-12-24 | DRG: 477 | Disposition: A | Discharge status: Skilled Nursing Facility | Payer: Medicare HMO | Attending: Internal Medicine | Admitting: Internal Medicine

## 2013-12-16 DIAGNOSIS — N1330 Unspecified hydronephrosis: Secondary | ICD-10-CM | POA: Diagnosis present

## 2013-12-16 DIAGNOSIS — Z510 Encounter for antineoplastic radiation therapy: Secondary | ICD-10-CM | POA: Diagnosis present

## 2013-12-16 DIAGNOSIS — C7951 Secondary malignant neoplasm of bone: Secondary | ICD-10-CM | POA: Diagnosis present

## 2013-12-16 DIAGNOSIS — E440 Moderate protein-calorie malnutrition: Secondary | ICD-10-CM | POA: Diagnosis present

## 2013-12-16 DIAGNOSIS — R310 Gross hematuria: Secondary | ICD-10-CM | POA: Diagnosis present

## 2013-12-16 DIAGNOSIS — Z7401 Bed confinement status: Secondary | ICD-10-CM

## 2013-12-16 DIAGNOSIS — Z79899 Other long term (current) drug therapy: Secondary | ICD-10-CM

## 2013-12-16 DIAGNOSIS — Z87891 Personal history of nicotine dependence: Secondary | ICD-10-CM

## 2013-12-16 DIAGNOSIS — N400 Enlarged prostate without lower urinary tract symptoms: Secondary | ICD-10-CM | POA: Diagnosis present

## 2013-12-16 DIAGNOSIS — E871 Hypo-osmolality and hyponatremia: Secondary | ICD-10-CM | POA: Diagnosis present

## 2013-12-16 DIAGNOSIS — R319 Hematuria, unspecified: Secondary | ICD-10-CM

## 2013-12-16 DIAGNOSIS — I10 Essential (primary) hypertension: Secondary | ICD-10-CM | POA: Diagnosis present

## 2013-12-16 DIAGNOSIS — M25559 Pain in unspecified hip: Secondary | ICD-10-CM | POA: Diagnosis present

## 2013-12-16 DIAGNOSIS — R392 Extrarenal uremia: Secondary | ICD-10-CM | POA: Diagnosis present

## 2013-12-16 DIAGNOSIS — R5381 Other malaise: Secondary | ICD-10-CM | POA: Diagnosis present

## 2013-12-16 DIAGNOSIS — E039 Hypothyroidism, unspecified: Secondary | ICD-10-CM | POA: Diagnosis present

## 2013-12-16 DIAGNOSIS — M25552 Pain in left hip: Secondary | ICD-10-CM | POA: Diagnosis present

## 2013-12-16 DIAGNOSIS — D62 Acute posthemorrhagic anemia: Secondary | ICD-10-CM | POA: Diagnosis not present

## 2013-12-16 DIAGNOSIS — E785 Hyperlipidemia, unspecified: Secondary | ICD-10-CM | POA: Diagnosis present

## 2013-12-16 DIAGNOSIS — N3289 Other specified disorders of bladder: Secondary | ICD-10-CM | POA: Diagnosis not present

## 2013-12-16 DIAGNOSIS — D638 Anemia in other chronic diseases classified elsewhere: Secondary | ICD-10-CM | POA: Diagnosis present

## 2013-12-16 DIAGNOSIS — Z6821 Body mass index (BMI) 21.0-21.9, adult: Secondary | ICD-10-CM | POA: Diagnosis not present

## 2013-12-16 DIAGNOSIS — I509 Heart failure, unspecified: Secondary | ICD-10-CM | POA: Diagnosis present

## 2013-12-16 DIAGNOSIS — Z888 Allergy status to other drugs, medicaments and biological substances status: Secondary | ICD-10-CM | POA: Diagnosis not present

## 2013-12-16 DIAGNOSIS — C679 Malignant neoplasm of bladder, unspecified: Secondary | ICD-10-CM

## 2013-12-16 DIAGNOSIS — R532 Functional quadriplegia: Secondary | ICD-10-CM | POA: Diagnosis present

## 2013-12-16 DIAGNOSIS — C678 Malignant neoplasm of overlapping sites of bladder: Secondary | ICD-10-CM | POA: Diagnosis not present

## 2013-12-16 DIAGNOSIS — D494 Neoplasm of unspecified behavior of bladder: Secondary | ICD-10-CM | POA: Diagnosis present

## 2013-12-16 DIAGNOSIS — I272 Other secondary pulmonary hypertension: Secondary | ICD-10-CM | POA: Diagnosis present

## 2013-12-16 DIAGNOSIS — R52 Pain, unspecified: Secondary | ICD-10-CM

## 2013-12-16 DIAGNOSIS — C7989 Secondary malignant neoplasm of other specified sites: Secondary | ICD-10-CM | POA: Diagnosis not present

## 2013-12-16 HISTORY — DX: Unspecified foreign body in respiratory tract, part unspecified causing other injury, initial encounter: T17.908A

## 2013-12-16 HISTORY — DX: Unspecified convulsions: R56.9

## 2013-12-16 HISTORY — DX: Other disorders of lung: J98.4

## 2013-12-16 HISTORY — DX: Pneumonia, unspecified organism: J18.9

## 2013-12-16 HISTORY — DX: Hyperosmolality and hypernatremia: E87.0

## 2013-12-16 HISTORY — DX: Heart failure, unspecified: I50.9

## 2013-12-16 HISTORY — DX: Unspecified fall, initial encounter: W19.XXXA

## 2013-12-16 HISTORY — DX: Unspecified injury of lung, unspecified, initial encounter: S27.309A

## 2013-12-16 HISTORY — DX: Retention of urine, unspecified: R33.9

## 2013-12-16 HISTORY — DX: Anemia, unspecified: D64.9

## 2013-12-16 HISTORY — DX: Pulmonary hypertension, unspecified: I27.20

## 2013-12-16 LAB — BASIC METABOLIC PANEL
ANION GAP: 12 (ref 5–15)
BUN: 30 mg/dL — ABNORMAL HIGH (ref 6–23)
CALCIUM: 8.5 mg/dL (ref 8.4–10.5)
CHLORIDE: 95 meq/L — AB (ref 96–112)
CO2: 25 mEq/L (ref 19–32)
CREATININE: 1.02 mg/dL (ref 0.50–1.35)
GFR, EST AFRICAN AMERICAN: 75 mL/min — AB (ref >90)
GFR, EST NON AFRICAN AMERICAN: 64 mL/min — AB (ref >90)
Glucose, Bld: 96 mg/dL (ref 70–99)
Potassium: 4.3 mEq/L (ref 3.7–5.3)
Sodium: 132 mEq/L — ABNORMAL LOW (ref 137–147)

## 2013-12-16 LAB — CBC
HCT: 30.2 % — ABNORMAL LOW (ref 39.0–52.0)
Hemoglobin: 10.2 g/dL — ABNORMAL LOW (ref 13.0–17.0)
MCH: 28.5 pg (ref 26.0–34.0)
MCHC: 33.8 g/dL (ref 30.0–36.0)
MCV: 84.4 fL (ref 78.0–100.0)
PLATELETS: 258 10*3/uL (ref 150–400)
RBC: 3.58 MIL/uL — ABNORMAL LOW (ref 4.22–5.81)
RDW: 12.4 % (ref 11.5–15.5)
WBC: 8.2 10*3/uL (ref 4.0–10.5)

## 2013-12-16 LAB — I-STAT CHEM 8, ED
BUN: 37 mg/dL — ABNORMAL HIGH (ref 6–23)
Calcium, Ion: 1.09 mmol/L — ABNORMAL LOW (ref 1.13–1.30)
Chloride: 97 mEq/L (ref 96–112)
Creatinine, Ser: 1.1 mg/dL (ref 0.50–1.35)
Glucose, Bld: 100 mg/dL — ABNORMAL HIGH (ref 70–99)
HCT: 33 % — ABNORMAL LOW (ref 39.0–52.0)
Hemoglobin: 11.2 g/dL — ABNORMAL LOW (ref 13.0–17.0)
Potassium: 4.4 mEq/L (ref 3.7–5.3)
Sodium: 132 mEq/L — ABNORMAL LOW (ref 137–147)
TCO2: 27 mmol/L (ref 0–100)

## 2013-12-16 MED ORDER — GADOBENATE DIMEGLUMINE 529 MG/ML IV SOLN
14.0000 mL | Freq: Once | INTRAVENOUS | Status: AC | PRN
  Administered 2013-12-16: 14 mL via INTRAVENOUS

## 2013-12-16 MED ORDER — ACETAMINOPHEN 500 MG PO TABS
1000.0000 mg | ORAL_TABLET | Freq: Once | ORAL | Status: AC
  Administered 2013-12-16: 1000 mg via ORAL
  Filled 2013-12-16: qty 2

## 2013-12-16 NOTE — ED Notes (Signed)
Pt returned from MRI °

## 2013-12-16 NOTE — ED Notes (Signed)
Patient transported to MRI 

## 2013-12-16 NOTE — ED Notes (Signed)
Per EMS- Patient is a resident of [facility]. Patient c/o left leg pain that starts at the left hip and radiates into the left foot. Patient and staff deny any injury Patient evaluated recently for left leg pain. Patient rates pain 8/10.

## 2013-12-16 NOTE — ED Provider Notes (Signed)
CSN: [id number]     Arrival date & time 12/16/13  1219 History   First MD Initiated Contact with Patient 12/16/13 1227     Chief Complaint  Patient presents with  . Hip Pain     HPI Pt was seen at 1245. Per EMS, NH report and pt, c/o gradual onset and persistence of constant left hip "pain" for the past 2 weeks. Pt states the pain worsens with palpation of the area and weight bearing.  Describes the pain as "aching."  States his PMD "took an xray and told me it was ok" and "then said I have to go to physical therapy." NH states pt at baseline is ambulatory with his walker but recently has only been able to ambulate several feet before needing to sit down due to c/o left hip pain. Denies injury, no abd pain, no back pain, no focal motor weakness, no tingling/numbness in extremity.    Past Medical History  Diagnosis Date  . Anemia   . Urinary retention   . CHF (congestive heart failure)   . Pulmonary hypertension   . Seizure   . Hypernatremia   . Aspiration into airway   . Lung injury   . Pneumonitis   . Fall    History reviewed. No pertinent past surgical history.   Family History  Problem Relation Age of Onset  . Family history unknown: Yes   History  Substance Use Topics  . Smoking status: Never Smoker   . Smokeless tobacco: Never Used  . Alcohol Use: No    Review of Systems ROS: Statement: All systems negative except as marked or noted in the HPI; Constitutional: Negative for fever and chills. ; ; Eyes: Negative for eye pain, redness and discharge. ; ; ENMT: Negative for ear pain, hoarseness, nasal congestion, sinus pressure and sore throat. ; ; Cardiovascular: Negative for chest pain, palpitations, diaphoresis, dyspnea and peripheral edema. ; ; Respiratory: Negative for cough, wheezing and stridor. ; ; Gastrointestinal: Negative for nausea, vomiting, diarrhea, abdominal pain, blood in stool, hematemesis, jaundice and rectal bleeding. . ; ; Genitourinary: Negative for  dysuria, flank pain and hematuria. ; ; Musculoskeletal: +left hip pain. Negative for back pain and neck pain. Negative for swelling and trauma.; ; Skin: Negative for pruritus, rash, abrasions, blisters, bruising and skin lesion.; ; Neuro: Negative for headache, lightheadedness and neck stiffness. Negative for weakness, altered level of consciousness , altered mental status, extremity weakness, paresthesias, involuntary movement, seizure and syncope.      Allergies  Hydrochlorothiazide  Home Medications   Prior to Admission medications   Medication Sig Start Date End Date Taking? Authorizing Provider  acetaminophen (TYLENOL) 325 MG tablet Take 650 mg by mouth every 6 (six) hours as needed (for pain).   Yes Historical Provider, MD  atorvastatin (LIPITOR) 10 MG tablet Take 10 mg by mouth at bedtime.   Yes Historical Provider, MD  calcium carbonate (OS-CAL - DOSED IN MG OF ELEMENTAL CALCIUM) 1250 MG tablet Take 1 tablet by mouth daily with breakfast.   Yes Historical Provider, MD  cholecalciferol (VITAMIN D) 400 UNITS TABS tablet Take 800 Units by mouth daily with breakfast.   Yes Historical Provider, MD  donepezil (ARICEPT) 5 MG tablet Take 5 mg by mouth at bedtime.   Yes Historical Provider, MD  ENSURE (ENSURE) Take 237 mLs by mouth 3 (three) times daily. Patient drinks Vanilla flavor   Yes Historical Provider, MD  finasteride (PROSCAR) 5 MG tablet Take 5 mg by mouth daily  with breakfast.   Yes Historical Provider, MD  Foot Care Products [facility] BUNION CUSHIONS) PADS Apply 1 applicator topically 3 (three) times a week.   Yes Historical Provider, MD  furosemide (LASIX) 40 MG tablet Take 40 mg by mouth daily with breakfast.   Yes Historical Provider, MD  hydrALAZINE (APRESOLINE) 25 MG tablet Take 25 mg by mouth 3 (three) times daily.   Yes Historical Provider, MD  levothyroxine (SYNTHROID, LEVOTHROID) 88 MCG tablet Take 88 mcg by mouth daily before breakfast.   Yes Historical Provider, MD  mineral  oil liquid Place 5 mLs into the right ear once a week. On Tuesday's   Yes Historical Provider, MD  naproxen sodium (ANAPROX) 220 MG tablet Take 220 mg by mouth 2 (two) times daily with a meal.   Yes Historical Provider, MD  nystatin (MYCOSTATIN/NYSTOP) 100000 UNIT/GM POWD Apply 1 g topically daily.   Yes Historical Provider, MD  polyethylene glycol (MIRALAX / GLYCOLAX) packet Take 17 g by mouth daily.   Yes Historical Provider, MD  psyllium (REGULOID) 0.52 G capsule Take 0.52 g by mouth at bedtime.   Yes Historical Provider, MD  tamsulosin (FLOMAX) 0.4 MG CAPS capsule Take 0.4 mg by mouth daily with breakfast.   Yes Historical Provider, MD   BP 128/59  Pulse 71  Temp(Src) 97.9 F (36.6 C) (Oral)  Resp 16  SpO2 96% Physical Exam 1250: Physical examination:  Nursing notes reviewed; Vital signs and O2 SAT reviewed;  Constitutional: Well developed, Well nourished, Well hydrated, In no acute distress; Head:  Normocephalic, atraumatic; Eyes: EOMI, PERRL, No scleral icterus; ENMT: Mouth and pharynx normal, Mucous membranes moist; Neck: Supple, Full range of motion, No lymphadenopathy; Cardiovascular: Regular rate and rhythm, No murmur, rub, or gallop; Respiratory: Breath sounds clear & equal bilaterally, No rales, rhonchi, wheezes.  Speaking full sentences with ease, Normal respiratory effort/excursion; Chest: Nontender, Movement normal; Abdomen: Soft, Nontender, Nondistended, Normal bowel sounds; Genitourinary: No CVA tenderness; Spine:  No midline CS, TS, LS tenderness.;; Extremities: Pulses normal, pelvis stable. +mild left hip tenderness to palp without deformity or edema. NT left knee/ankle/foot. No calf edema or asymmetry.; Neuro: AA&Ox3, Major CN grossly intact.  Speech clear. No gross focal motor or sensory deficits in extremities.; Skin: Color normal, Warm, Dry.   ED Course  Procedures     EKG Interpretation None      MDM  MDM Reviewed: previous chart, nursing note and  vitals Interpretation: x-ray and CT scan   Results for orders placed during the hospital encounter of 12/16/13  I-STAT CHEM 8, ED      Result Value Ref Range   Sodium 132 (*) 137 - 147 mEq/L   Potassium 4.4  3.7 - 5.3 mEq/L   Chloride 97  96 - 112 mEq/L   BUN 37 (*) 6 - 23 mg/dL   Creatinine, Ser 1.10  0.50 - 1.35 mg/dL   Glucose, Bld 100 (*) 70 - 99 mg/dL   Calcium, Ion 1.09 (*) 1.13 - 1.30 mmol/L   TCO2 27  0 - 100 mmol/L   Hemoglobin 11.2 (*) 13.0 - 17.0 g/dL   HCT 33.0 (*) 39.0 - 52.0 %   Dg Lumbar Spine Complete 12/16/2013   CLINICAL DATA:  Left hip pain, low back pain.  No known injury.  EXAM: LUMBAR SPINE - COMPLETE 4+ VIEW  COMPARISON:  CT of the abdomen and pelvis 05/07/2006  FINDINGS: Diffuse osteopenia. Postoperative changes in the lumbar spine. Normal alignment. No fracture. Mild degenerative facet disease  throughout the lumbar spine. Early degenerative spurring anteriorly.  Diffuse aortic calcifications without visible aneurysm.  IMPRESSION: No acute bony abnormality.   Electronically Signed   By: Rolm Baptise M.D.   On: 12/16/2013 13:39   Dg Hip Complete Left 12/16/2013   CLINICAL DATA:  Left hip pain without injury.  EXAM: LEFT HIP - COMPLETE 2+ VIEW  COMPARISON:  None.  FINDINGS: There is no evidence of hip fracture or dislocation. There is no evidence of arthropathy or other focal bone abnormality.  IMPRESSION: Normal left hip.   Electronically Signed   By: Sabino Dick M.D.   On: 12/16/2013 13:41   Ct Hip Left Wo Contrast 12/16/2013   CLINICAL DATA:  LEFT leg pain. Pain extending from hip to the foot. Initial encounter. 8/10 hip pain.  EXAM: CT OF THE LEFT HIP WITHOUT CONTRAST  TECHNIQUE: Multidetector CT imaging of the left hip was performed according to the standard protocol. Multiplanar CT image reconstructions were also generated.  COMPARISON:  Radiographs earlier today.  FINDINGS: There is no hip fracture. Severe atherosclerosis. The LEFT obturator ring appears intact.  The hip is mildly flexed. No hip effusion is evident.  Partial visualization of the anatomic pelvis shows an exophytic nodule in the bladder base, with some calcifications. Bladder tumor such as transitional cell carcinoma cannot be excluded. Followup urology consultation should be considered.  There is soft tissue attenuation in the marrow space of the LEFT supra-acetabular ilium, along with . Although this could represent active red marrow and superimposed osteopenia, on osteolytic lesion cannot be excluded. This area is only partially visible. If the patient is a candidate for MRI, that would be the preferred modality for further evaluation with and without contrast. Notably, marrow attenuation in other osteopenic bones appears normal and fatty.  IMPRESSION: 1. No acute osseous abnormality.  Negative for hip fracture. 2. Abnormal LEFT supra-acetabular ilium marrow space, suspicious for an osteolytic lesion although osteopenia and superimposed active red marrow could mimic this appearance. Followup MRI with and without contrast recommended if there are no contraindications. 3. Calcified nodule in the bladder base is partially visible. Followup urology consultation recommended.   Electronically Signed   By: Dereck Ligas M.D.   On: 12/16/2013 14:55    1605:  Pt unable to stand in the ED. MRI hip ordered to further clarify CT scan findings. Sign out to Dr. Mingo Amber.   Francine Graven, DO 12/16/13 [phone]

## 2013-12-16 NOTE — H&P (Signed)
[facility] History and Physical  PRUITT TABOADA NAT:[id number] DOB: 03-07-1925 DOA: 12/16/2013  Referring physician: Evelina Bucy, MD PCP: No primary provider on file.   Chief Complaint: Left Hip pain  HPI: William Baker is a 80 y.o. male presents with pain in hip. Patient presented to the ED with pain in his left hip. There was concern for fracture so it was scanned. On scanning it was noted that he has lytic lesions and an MRI shows lytic lesions and mass on the bladder likely carcinoma. Patient states that he is not able to stand or bear weight. Patient states pain is severe. Located mainly on the left side. Patient states that he has no chest pain no fevers or chills are noted. He denies having any headaches. Patient has no nausea vomiting or diarrhea. He has a history of prostate issues in the past. Patient states no weight loss.   Review of Systems:  Constitutional:  No weight loss, night sweats, Fevers, chills, fatigue HEENT:  No headaches, Difficulty swallowing Cardio-vascular:  No chest pain, Orthopnea, PND, swelling in lower extremities  GI:  No heartburn, indigestion, abdominal pain, nausea, vomiting, diarrhea Resp:  No shortness of breath with exertion or at rest. No excess mucus, no productive cough, No coughing up of blood  Skin:  no rash or lesions GU:  no dysuria, change in color of urine, no urgency or frequency  Musculoskeletal:  No joint pain or swelling. No decreased range of motion  Psych:  No change in mood or affect. No depression or anxiety   Past Medical History  Diagnosis Date  . Anemia   . Urinary retention   . CHF (congestive heart failure)   . Pulmonary hypertension   . Seizure   . Hypernatremia   . Aspiration into airway   . Lung injury   . Pneumonitis   . Fall    History reviewed. No pertinent past surgical history. Social History:  reports that he has never smoked. He has never used smokeless tobacco. He reports that he does not  drink alcohol or use illicit drugs.  Allergies  Allergen Reactions  . Hydrochlorothiazide Other (See Comments)    Hyponatremia    Family History  Problem Relation Age of Onset  . Family history unknown: Yes     Prior to Admission medications   Medication Sig Start Date End Date Taking? Authorizing Provider  acetaminophen (TYLENOL) 325 MG tablet Take 650 mg by mouth every 6 (six) hours as needed (for pain).   Yes Historical Provider, MD  atorvastatin (LIPITOR) 10 MG tablet Take 10 mg by mouth at bedtime.   Yes Historical Provider, MD  calcium carbonate (OS-CAL - DOSED IN MG OF ELEMENTAL CALCIUM) 1250 MG tablet Take 1 tablet by mouth daily with breakfast.   Yes Historical Provider, MD  cholecalciferol (VITAMIN D) 400 UNITS TABS tablet Take 800 Units by mouth daily with breakfast.   Yes Historical Provider, MD  donepezil (ARICEPT) 5 MG tablet Take 5 mg by mouth at bedtime.   Yes Historical Provider, MD  ENSURE (ENSURE) Take 237 mLs by mouth 3 (three) times daily. Patient drinks Vanilla flavor   Yes Historical Provider, MD  finasteride (PROSCAR) 5 MG tablet Take 5 mg by mouth daily with breakfast.   Yes Historical Provider, MD  Foot Care Products [facility] BUNION CUSHIONS) PADS Apply 1 applicator topically 3 (three) times a week.   Yes Historical Provider, MD  furosemide (LASIX) 40 MG tablet Take 40 mg by mouth  daily with breakfast.   Yes Historical Provider, MD  hydrALAZINE (APRESOLINE) 25 MG tablet Take 25 mg by mouth 3 (three) times daily.   Yes Historical Provider, MD  levothyroxine (SYNTHROID, LEVOTHROID) 88 MCG tablet Take 88 mcg by mouth daily before breakfast.   Yes Historical Provider, MD  mineral oil liquid Place 5 mLs into the right ear once a week. On Tuesday's   Yes Historical Provider, MD  naproxen sodium (ANAPROX) 220 MG tablet Take 220 mg by mouth 2 (two) times daily with a meal.   Yes Historical Provider, MD  nystatin (MYCOSTATIN/NYSTOP) 100000 UNIT/GM POWD Apply 1 g topically  daily.   Yes Historical Provider, MD  polyethylene glycol (MIRALAX / GLYCOLAX) packet Take 17 g by mouth daily.   Yes Historical Provider, MD  psyllium (REGULOID) 0.52 G capsule Take 0.52 g by mouth at bedtime.   Yes Historical Provider, MD  tamsulosin (FLOMAX) 0.4 MG CAPS capsule Take 0.4 mg by mouth daily with breakfast.   Yes Historical Provider, MD   Physical Exam: Filed Vitals:   12/16/13 1506 12/16/13 1957 12/16/13 2100 12/16/13 2130  BP: 140/62 151/65 143/60 159/68  Pulse: 67 65 62 71  Temp:      TempSrc:      Resp: 18 16 17 17   SpO2: 98% 98% 97% 98%    Wt Readings from Last 3 Encounters:  12/31/07 68.765 kg (151 lb 9.6 oz)  03/31/07 66.86 kg (147 lb 6.4 oz)  03/03/07 67.223 kg (148 lb 3.2 oz)    General:  Appears calm and comfortable Eyes: PERRL, normal lids, irises & conjunctiva ENT: grossly normal hearing, lips & tongue Neck: no LAD, masses or thyromegaly Cardiovascular: RRR, no m/r/g. No LE edema. Respiratory: CTA bilaterally, no w/r/r. Normal respiratory effort. Abdomen: soft, ntnd Skin: no rash or induration seen on limited exam Musculoskeletal: Limited ROM noted on left LE Psychiatric: grossly normal mood and affect, speech fluent and appropriate Neurologic: grossly non-focal.          Labs on Admission:  Basic Metabolic Panel:  Recent Labs Lab 12/16/13 1555 12/16/13 2223  NA 132* 132*  K 4.4 4.3  CL 97 95*  CO2  --  25  GLUCOSE 100* 96  BUN 37* 30*  CREATININE 1.10 1.02  CALCIUM  --  8.5   Liver Function Tests: No results found for this basename: AST, ALT, ALKPHOS, BILITOT, PROT, ALBUMIN,  in the last 168 hours No results found for this basename: LIPASE, AMYLASE,  in the last 168 hours No results found for this basename: AMMONIA,  in the last 168 hours CBC:  Recent Labs Lab 12/16/13 1555 12/16/13 2223  WBC  --  8.2  HGB 11.2* 10.2*  HCT 33.0* 30.2*  MCV  --  84.4  PLT  --  258   Cardiac Enzymes: No results found for this basename:  CKTOTAL, CKMB, CKMBINDEX, TROPONINI,  in the last 168 hours  BNP (last 3 results) No results found for this basename: PROBNP,  in the last 8760 hours CBG: No results found for this basename: GLUCAP,  in the last 168 hours  Radiological Exams on Admission: Dg Lumbar Spine Complete  12/16/2013   CLINICAL DATA:  Left hip pain, low back pain.  No known injury.  EXAM: LUMBAR SPINE - COMPLETE 4+ VIEW  COMPARISON:  CT of the abdomen and pelvis 05/07/2006  FINDINGS: Diffuse osteopenia. Postoperative changes in the lumbar spine. Normal alignment. No fracture. Mild degenerative facet disease throughout the lumbar spine.  Early degenerative spurring anteriorly.  Diffuse aortic calcifications without visible aneurysm.  IMPRESSION: No acute bony abnormality.   Electronically Signed   By: Rolm Baptise M.D.   On: 12/16/2013 13:39   Dg Hip Complete Left  12/16/2013   CLINICAL DATA:  Left hip pain without injury.  EXAM: LEFT HIP - COMPLETE 2+ VIEW  COMPARISON:  None.  FINDINGS: There is no evidence of hip fracture or dislocation. There is no evidence of arthropathy or other focal bone abnormality.  IMPRESSION: Normal left hip.   Electronically Signed   By: Sabino Dick M.D.   On: 12/16/2013 13:41   Mr Hip Left W Wo Contrast  12/16/2013   CLINICAL DATA:  Left hip pain for 2 weeks increasing with weight-bearing. CT scan revealed a mass in the left iliac bone.  EXAM: MRI OF THE LEFT HIP WITHOUT AND WITH CONTRAST  TECHNIQUE: Multiplanar, multisequence MR imaging was performed both before and after administration of intravenous contrast.  CONTRAST:  16mL MULTIHANCE GADOBENATE DIMEGLUMINE 529 MG/ML IV SOLN  COMPARISON:  12/16/2013  FINDINGS: Extensive abnormal marrow infiltration with edema and enhancement along with a lytic expansile lesion of the left iliac bone measuring 6.7 by 2.8 by 5.6 cm, with a small amount of extraosseous spread tracking deep to the posterior portion of the gluteus minimus and possibly deep to  the iliacus muscle. No sciatic notch impingement or direct impingement on the sciatic nerve. No obturator impingement due to this process.  There is a right hydroureter extending down to the UVJ. In the vicinity of the right UVJ, there is a polypoid 2.6 cm enhancing mass sitting atop a sessile 6.1 by 3.4 by 1.8 cm bladder mass along the urothelium. I do not see a similar filling defect in the visualized portion of the right ureter itself.  There is abnormal edema tracking through the soft tissues below the right ischium to the skin surface. No proximal femoral lesion is observed.  IMPRESSION:  1. Expansile lytic left iliac bone lesion with early extraosseous spread posteriorly and anteriorly, extending to the superior acetabular margin. No hip effusion or synovial enhancement in the hip joint. Appearance compatible with a metastatic lesion given the presence of the obvious tumor in the right-sided the urinary bladder is with sessile and polypoid components favoring transitional cell carcinoma. Conceivably this could represent superimposed myeloma or fibrous dysplasia but a metastatic lesion is strongly favored. I do not see an other bony lesion in the pelvis or proximal femurs.   Electronically Signed   By: Sherryl Barters M.D.   On: 12/16/2013 20:32   Ct Hip Left Wo Contrast  12/16/2013   CLINICAL DATA:  LEFT leg pain. Pain extending from hip to the foot. Initial encounter. 8/10 hip pain.  EXAM: CT OF THE LEFT HIP WITHOUT CONTRAST  TECHNIQUE: Multidetector CT imaging of the left hip was performed according to the standard protocol. Multiplanar CT image reconstructions were also generated.  COMPARISON:  Radiographs earlier today.  FINDINGS: There is no hip fracture. Severe atherosclerosis. The LEFT obturator ring appears intact. The hip is mildly flexed. No hip effusion is evident.  Partial visualization of the anatomic pelvis shows an exophytic nodule in the bladder base, with some calcifications. Bladder  tumor such as transitional cell carcinoma cannot be excluded. Followup urology consultation should be considered.  There is soft tissue attenuation in the marrow space of the LEFT supra-acetabular ilium, along with . Although this could represent active red marrow and superimposed osteopenia, on osteolytic  lesion cannot be excluded. This area is only partially visible. If the patient is a candidate for MRI, that would be the preferred modality for further evaluation with and without contrast. Notably, marrow attenuation in other osteopenic bones appears normal and fatty.  IMPRESSION: 1. No acute osseous abnormality.  Negative for hip fracture. 2. Abnormal LEFT supra-acetabular ilium marrow space, suspicious for an osteolytic lesion although osteopenia and superimposed active red marrow could mimic this appearance. Followup MRI with and without contrast recommended if there are no contraindications. 3. Calcified nodule in the bladder base is partially visible. Followup urology consultation recommended.   Electronically Signed   By: Dereck Ligas M.D.   On: 12/16/2013 14:55      Assessment/Plan Principal Problem:   Hip pain Active Problems:   Hypothyroidism   HYPERTENSION, BENIGN SYSTEMIC   Malignant neoplasm of urinary bladder   Neoplasm of bladder   1. Hip Pain -patient is unable to ambulate -will be admitted primarily for pain control -cause of pain is likely the neoplasm with mets to bones -will need workup for this while here  2. Neoplasm of bladder -new finding -will get urology consultation -will monitor urinary output  3. Hypertension -will be continued on home medications -pressure currently was elevated  4. Hypothyroid -will check TSH -continue with home meds  5. Anemia -will get iron studies  6. Hyponatremia -will hydrate -IV NS started    Code Status: Full Code (must indicate code status--if unknown or must be presumed, indicate so) DVT  Prophylaxis:Heparin Family Communication: None (indicate person spoken with, if applicable, with phone number if by telephone) Disposition Plan: SNF (indicate anticipated LOS)  Time spent: 37min  KHAN,SAADAT A [facility] Pager [phone]

## 2013-12-16 NOTE — ED Provider Notes (Signed)
1600 - Care from Dr. Thurnell Garbe. Awaiting MRI hip for possible lytic lesion seen on CT of hip. Here for hip pain, inability to walk. 2050 - MRI shows large lytic lesion, likely bladder cancer. I explained this to the patient. He is unable to walk around here. Will get basic labs, admit for inability to walk.  1. Left hip pain   2. Hip pain   3. Pain    I have reviewed all labs and imaging and considered them in my medical decision making.    MR Hip Left W Wo Contrast (Final result)  Result time: 12/16/13 99:37:16    Final result by Rad Results In Interface (12/16/13 96:78:93)    Narrative:   CLINICAL DATA: Left hip pain for 2 weeks increasing with weight-bearing. CT scan revealed a mass in the left iliac bone.  EXAM: MRI OF THE LEFT HIP WITHOUT AND WITH CONTRAST  TECHNIQUE: Multiplanar, multisequence MR imaging was performed both before and after administration of intravenous contrast.  CONTRAST: 58mL MULTIHANCE GADOBENATE DIMEGLUMINE 529 MG/ML IV SOLN  COMPARISON: 12/16/2013  FINDINGS: Extensive abnormal marrow infiltration with edema and enhancement along with a lytic expansile lesion of the left iliac bone measuring 6.7 by 2.8 by 5.6 cm, with a small amount of extraosseous spread tracking deep to the posterior portion of the gluteus minimus and possibly deep to the iliacus muscle. No sciatic notch impingement or direct impingement on the sciatic nerve. No obturator impingement due to this process.  There is a right hydroureter extending down to the UVJ. In the vicinity of the right UVJ, there is a polypoid 2.6 cm enhancing mass sitting atop a sessile 6.1 by 3.4 by 1.8 cm bladder mass along the urothelium. I do not see a similar filling defect in the visualized portion of the right ureter itself.  There is abnormal edema tracking through the soft tissues below the right ischium to the skin surface. No proximal femoral lesion is observed.  IMPRESSION:  1. Expansile  lytic left iliac bone lesion with early extraosseous spread posteriorly and anteriorly, extending to the superior acetabular margin. No hip effusion or synovial enhancement in the hip joint. Appearance compatible with a metastatic lesion given the presence of the obvious tumor in the right-sided the urinary bladder is with sessile and polypoid components favoring transitional cell carcinoma. Conceivably this could represent superimposed myeloma or fibrous dysplasia but a metastatic lesion is strongly favored. I do not see an other bony lesion in the pelvis or proximal femurs.   Electronically Signed By: Sherryl Barters M.D. On: 12/16/2013 20:32             CT Hip Left Wo Contrast (Final result)  Result time: 12/16/13 14:55:24    Final result by Rad Results In Interface (12/16/13 14:55:24)    Narrative:   CLINICAL DATA: LEFT leg pain. Pain extending from hip to the foot. Initial encounter. 8/10 hip pain.  EXAM: CT OF THE LEFT HIP WITHOUT CONTRAST  TECHNIQUE: Multidetector CT imaging of the left hip was performed according to the standard protocol. Multiplanar CT image reconstructions were also generated.  COMPARISON: Radiographs earlier today.  FINDINGS: There is no hip fracture. Severe atherosclerosis. The LEFT obturator ring appears intact. The hip is mildly flexed. No hip effusion is evident.  Partial visualization of the anatomic pelvis shows an exophytic nodule in the bladder base, with some calcifications. Bladder tumor such as transitional cell carcinoma cannot be excluded. Followup urology consultation should be considered.  There is soft tissue  attenuation in the marrow space of the LEFT supra-acetabular ilium, along with . Although this could represent active red marrow and superimposed osteopenia, on osteolytic lesion cannot be excluded. This area is only partially visible. If the patient is a candidate for MRI, that would be the preferred modality for  further evaluation with and without contrast. Notably, marrow attenuation in other osteopenic bones appears normal and fatty.  IMPRESSION: 1. No acute osseous abnormality. Negative for hip fracture. 2. Abnormal LEFT supra-acetabular ilium marrow space, suspicious for an osteolytic lesion although osteopenia and superimposed active red marrow could mimic this appearance. Followup MRI with and without contrast recommended if there are no contraindications. 3. Calcified nodule in the bladder base is partially visible. Followup urology consultation recommended.   Electronically Signed By: Dereck Ligas M.D. On: 12/16/2013 14:55             DG Lumbar Spine Complete (Final result)  Result time: 12/16/13 13:39:58    Final result by Rad Results In Interface (12/16/13 13:39:58)    Narrative:   CLINICAL DATA: Left hip pain, low back pain. No known injury.  EXAM: LUMBAR SPINE - COMPLETE 4+ VIEW  COMPARISON: CT of the abdomen and pelvis 05/07/2006  FINDINGS: Diffuse osteopenia. Postoperative changes in the lumbar spine. Normal alignment. No fracture. Mild degenerative facet disease throughout the lumbar spine. Early degenerative spurring anteriorly.  Diffuse aortic calcifications without visible aneurysm.  IMPRESSION: No acute bony abnormality.   Electronically Signed By: Rolm Baptise M.D. On: 12/16/2013 13:39             DG Hip Complete Left (Final result)  Result time: 12/16/13 13:41:11    Final result by Rad Results In Interface (12/16/13 13:41:11)    Narrative:   CLINICAL DATA: Left hip pain without injury.  EXAM: LEFT HIP - COMPLETE 2+ VIEW  COMPARISON: None.  FINDINGS: There is no evidence of hip fracture or dislocation. There is no evidence of arthropathy or other focal bone abnormality.  IMPRESSION: Normal left hip.   Electronically Signed By: Sabino Dick M.D. On: 12/16/2013 13:41      Evelina Bucy, MD 12/16/13 2228

## 2013-12-16 NOTE — ED Notes (Signed)
Patient transported to X-ray 

## 2013-12-16 NOTE — ED Notes (Signed)
Attempted to ambulate the pt. Pt was unable to bear any weight on the left leg and could not stand at bedside.

## 2013-12-16 NOTE — ED Notes (Addendum)
William Baker from [facility] stated that the patient would walk a few feet with walker and then sit down.

## 2013-12-16 NOTE — ED Notes (Signed)
Bed: WA01 Expected date:  Expected time:  Means of arrival:  Comments: EMS leg pain

## 2013-12-17 ENCOUNTER — Inpatient Hospital Stay (HOSPITAL_COMMUNITY): Payer: Medicare HMO

## 2013-12-17 ENCOUNTER — Ambulatory Visit
Admit: 2013-12-17 | Discharge: 2013-12-17 | Disposition: A | Discharge status: Home / Self Care | Payer: Medicare HMO | Attending: Radiation Oncology | Admitting: Radiation Oncology

## 2013-12-17 ENCOUNTER — Encounter: Payer: Self-pay | Admitting: Radiation Oncology

## 2013-12-17 DIAGNOSIS — R319 Hematuria, unspecified: Secondary | ICD-10-CM

## 2013-12-17 DIAGNOSIS — M899 Disorder of bone, unspecified: Secondary | ICD-10-CM

## 2013-12-17 DIAGNOSIS — C679 Malignant neoplasm of bladder, unspecified: Secondary | ICD-10-CM

## 2013-12-17 DIAGNOSIS — E440 Moderate protein-calorie malnutrition: Secondary | ICD-10-CM | POA: Insufficient documentation

## 2013-12-17 DIAGNOSIS — N329 Bladder disorder, unspecified: Secondary | ICD-10-CM

## 2013-12-17 LAB — IRON AND TIBC
Iron: 27 ug/dL — ABNORMAL LOW (ref 42–135)
Saturation Ratios: 11 % — ABNORMAL LOW (ref 20–55)
TIBC: 250 ug/dL (ref 215–435)
UIBC: 223 ug/dL (ref 125–400)

## 2013-12-17 LAB — CBC
HCT: 29.2 % — ABNORMAL LOW (ref 39.0–52.0)
HEMOGLOBIN: 9.5 g/dL — AB (ref 13.0–17.0)
MCH: 27.9 pg (ref 26.0–34.0)
MCHC: 32.5 g/dL (ref 30.0–36.0)
MCV: 85.9 fL (ref 78.0–100.0)
Platelets: 240 10*3/uL (ref 150–400)
RBC: 3.4 MIL/uL — AB (ref 4.22–5.81)
RDW: 12.6 % (ref 11.5–15.5)
WBC: 8.4 10*3/uL (ref 4.0–10.5)

## 2013-12-17 LAB — COMPREHENSIVE METABOLIC PANEL
ALT: 9 U/L (ref 0–53)
AST: 18 U/L (ref 0–37)
Albumin: 3 g/dL — ABNORMAL LOW (ref 3.5–5.2)
Alkaline Phosphatase: 109 U/L (ref 39–117)
Anion gap: 11 (ref 5–15)
BUN: 31 mg/dL — ABNORMAL HIGH (ref 6–23)
CO2: 25 meq/L (ref 19–32)
Calcium: 8.4 mg/dL (ref 8.4–10.5)
Chloride: 97 mEq/L (ref 96–112)
Creatinine, Ser: 1.07 mg/dL (ref 0.50–1.35)
GFR calc Af Amer: 70 mL/min — ABNORMAL LOW (ref >90)
GFR, EST NON AFRICAN AMERICAN: 61 mL/min — AB (ref >90)
Glucose, Bld: 107 mg/dL — ABNORMAL HIGH (ref 70–99)
POTASSIUM: 4.3 meq/L (ref 3.7–5.3)
SODIUM: 133 meq/L — AB (ref 137–147)
TOTAL PROTEIN: 6.8 g/dL (ref 6.0–8.3)
Total Bilirubin: 0.4 mg/dL (ref 0.3–1.2)

## 2013-12-17 LAB — CREATININE, SERUM
Creatinine, Ser: 0.96 mg/dL (ref 0.50–1.35)
GFR calc Af Amer: 85 mL/min — ABNORMAL LOW (ref >90)
GFR calc non Af Amer: 73 mL/min — ABNORMAL LOW (ref >90)

## 2013-12-17 LAB — HEMOGLOBIN A1C
Hgb A1c MFr Bld: 5.6 % (ref <5.7)
Mean Plasma Glucose: 114 mg/dL (ref <117)

## 2013-12-17 LAB — GLUCOSE, CAPILLARY: Glucose-Capillary: 106 mg/dL — ABNORMAL HIGH (ref 70–99)

## 2013-12-17 LAB — BUN: BUN: 28 mg/dL — ABNORMAL HIGH (ref 6–23)

## 2013-12-17 LAB — TSH: TSH: 4.64 u[IU]/mL — ABNORMAL HIGH (ref 0.350–4.500)

## 2013-12-17 MED ORDER — MORPHINE SULFATE 2 MG/ML IJ SOLN
1.0000 mg | INTRAMUSCULAR | Status: DC | PRN
Start: 2013-12-17 — End: 2013-12-24
  Administered 2013-12-17: 1 mg via INTRAVENOUS
  Filled 2013-12-17: qty 1

## 2013-12-17 MED ORDER — OXYCODONE HCL 5 MG PO TABS
5.0000 mg | ORAL_TABLET | ORAL | Status: DC | PRN
  Administered 2013-12-18 – 2013-12-24 (×6): 5 mg via ORAL
  Filled 2013-12-17 (×7): qty 1

## 2013-12-17 MED ORDER — ONDANSETRON HCL 4 MG PO TABS: 4.0000 mg | ORAL_TABLET | Freq: Four times a day (QID) | ORAL | Status: DC | PRN

## 2013-12-17 MED ORDER — FUROSEMIDE 40 MG PO TABS
40.0000 mg | ORAL_TABLET | Freq: Every day | ORAL | Status: DC
Start: 2013-12-17 — End: 2013-12-17
  Administered 2013-12-17: 40 mg via ORAL
  Filled 2013-12-17: qty 1

## 2013-12-17 MED ORDER — NYSTATIN 100000 UNIT/GM EX POWD
1.0000 g | Freq: Every day | CUTANEOUS | Status: DC
  Administered 2013-12-17 – 2013-12-24 (×7): 1 g via TOPICAL
  Filled 2013-12-17: qty 15

## 2013-12-17 MED ORDER — ENSURE COMPLETE PO LIQD
237.0000 mL | Freq: Three times a day (TID) | ORAL | Status: DC
  Administered 2013-12-17 – 2013-12-24 (×16): 237 mL via ORAL

## 2013-12-17 MED ORDER — FOLIC ACID 1 MG PO TABS
1.0000 mg | ORAL_TABLET | Freq: Every day | ORAL | Status: DC
  Administered 2013-12-17 – 2013-12-24 (×7): 1 mg via ORAL
  Filled 2013-12-17 (×8): qty 1

## 2013-12-17 MED ORDER — MINERAL OIL PO OIL
5.0000 mL | TOPICAL_OIL | ORAL | Status: DC
  Administered 2013-12-22: 5 mL via OTIC
  Filled 2013-12-17: qty 30

## 2013-12-17 MED ORDER — TAMSULOSIN HCL 0.4 MG PO CAPS
0.4000 mg | ORAL_CAPSULE | Freq: Every day | ORAL | Status: DC
  Administered 2013-12-17 – 2013-12-24 (×8): 0.4 mg via ORAL
  Filled 2013-12-17 (×8): qty 1

## 2013-12-17 MED ORDER — BISACODYL 10 MG RE SUPP
10.0000 mg | Freq: Every day | RECTAL | Status: DC | PRN
  Administered 2013-12-22: 10 mg via RECTAL
  Filled 2013-12-17: qty 1

## 2013-12-17 MED ORDER — NAPROXEN 250 MG PO TABS
250.0000 mg | ORAL_TABLET | Freq: Two times a day (BID) | ORAL | Status: DC
  Administered 2013-12-17 – 2013-12-24 (×11): 250 mg via ORAL
  Filled 2013-12-17 (×18): qty 1

## 2013-12-17 MED ORDER — ONDANSETRON HCL 4 MG/2ML IJ SOLN: 4.0000 mg | Freq: Four times a day (QID) | INTRAMUSCULAR | Status: DC | PRN

## 2013-12-17 MED ORDER — CHOLECALCIFEROL 10 MCG (400 UNIT) PO TABS
800.0000 [IU] | ORAL_TABLET | Freq: Every day | ORAL | Status: DC
  Administered 2013-12-17 – 2013-12-24 (×7): 800 [IU] via ORAL
  Filled 2013-12-17 (×10): qty 2

## 2013-12-17 MED ORDER — FLEET ENEMA 7-19 GM/118ML RE ENEM: 1.0000 | ENEMA | Freq: Once | RECTAL | Status: AC | PRN

## 2013-12-17 MED ORDER — ACETAMINOPHEN 325 MG PO TABS: 650.0000 mg | ORAL_TABLET | Freq: Four times a day (QID) | ORAL | Status: DC | PRN

## 2013-12-17 MED ORDER — FINASTERIDE 5 MG PO TABS
5.0000 mg | ORAL_TABLET | Freq: Every day | ORAL | Status: DC
  Administered 2013-12-17 – 2013-12-24 (×7): 5 mg via ORAL
  Filled 2013-12-17 (×8): qty 1

## 2013-12-17 MED ORDER — DONEPEZIL HCL 5 MG PO TABS
5.0000 mg | ORAL_TABLET | Freq: Every day | ORAL | Status: DC
  Administered 2013-12-17 – 2013-12-23 (×8): 5 mg via ORAL
  Filled 2013-12-17 (×8): qty 1

## 2013-12-17 MED ORDER — ADULT MULTIVITAMIN W/MINERALS CH
1.0000 | ORAL_TABLET | Freq: Every day | ORAL | Status: DC
  Administered 2013-12-17 – 2013-12-24 (×6): 1 via ORAL
  Filled 2013-12-17 (×8): qty 1

## 2013-12-17 MED ORDER — PSYLLIUM 95 % PO PACK
1.0000 | PACK | Freq: Every day | ORAL | Status: DC
  Administered 2013-12-17 – 2013-12-23 (×8): 1 via ORAL
  Filled 2013-12-17 (×8): qty 1

## 2013-12-17 MED ORDER — ATORVASTATIN CALCIUM 10 MG PO TABS
10.0000 mg | ORAL_TABLET | Freq: Every day | ORAL | Status: DC
  Administered 2013-12-17 – 2013-12-23 (×8): 10 mg via ORAL
  Filled 2013-12-17 (×8): qty 1

## 2013-12-17 MED ORDER — HYDRALAZINE HCL 25 MG PO TABS
25.0000 mg | ORAL_TABLET | Freq: Three times a day (TID) | ORAL | Status: DC
Start: 2013-12-17 — End: 2013-12-24
  Administered 2013-12-17 – 2013-12-24 (×21): 25 mg via ORAL
  Filled 2013-12-17 (×23): qty 1

## 2013-12-17 MED ORDER — LEVOTHYROXINE SODIUM 88 MCG PO TABS
88.0000 ug | ORAL_TABLET | Freq: Every day | ORAL | Status: DC
Start: 2013-12-17 — End: 2013-12-24
  Administered 2013-12-17 – 2013-12-24 (×8): 88 ug via ORAL
  Filled 2013-12-17 (×9): qty 1

## 2013-12-17 MED ORDER — ACETAMINOPHEN 650 MG RE SUPP: 650.0000 mg | Freq: Four times a day (QID) | RECTAL | Status: DC | PRN

## 2013-12-17 MED ORDER — VITAMIN B-1 100 MG PO TABS
100.0000 mg | ORAL_TABLET | Freq: Every day | ORAL | Status: DC
  Administered 2013-12-17 – 2013-12-24 (×7): 100 mg via ORAL
  Filled 2013-12-17 (×8): qty 1

## 2013-12-17 MED ORDER — CALCIUM CARBONATE 1250 (500 CA) MG PO TABS
1.0000 | ORAL_TABLET | Freq: Every day | ORAL | Status: DC
  Administered 2013-12-17 – 2013-12-24 (×7): 500 mg via ORAL
  Filled 2013-12-17 (×10): qty 1

## 2013-12-17 MED ORDER — HEPARIN SODIUM (PORCINE) 5000 UNIT/ML IJ SOLN
5000.0000 [IU] | Freq: Three times a day (TID) | INTRAMUSCULAR | Status: DC
  Administered 2013-12-17 (×2): 5000 [IU] via SUBCUTANEOUS
  Filled 2013-12-17 (×3): qty 1

## 2013-12-17 MED ORDER — SODIUM CHLORIDE 0.9 % IV SOLN
INTRAVENOUS | Status: DC
  Administered 2013-12-17 – 2013-12-19 (×4): via INTRAVENOUS

## 2013-12-17 MED ORDER — POLYETHYLENE GLYCOL 3350 17 G PO PACK
17.0000 g | PACK | Freq: Every day | ORAL | Status: DC
  Administered 2013-12-17 – 2013-12-24 (×6): 17 g via ORAL
  Filled 2013-12-17 (×8): qty 1

## 2013-12-17 MED ORDER — IOHEXOL 300 MG/ML  SOLN
100.0000 mL | Freq: Once | INTRAMUSCULAR | Status: AC | PRN
  Administered 2013-12-17: 100 mL via INTRAVENOUS

## 2013-12-17 NOTE — Progress Notes (Signed)
Radiation Oncology         ([phone]) [phone] ________________________________  Initial Inpatient Consultation  Name: William Baker MRN: [id number]  Date: 12/17/2013  DOB: June 03, 1925  CC:No primary provider on file.  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 male who is seen out courtesy of Dr. Doyle Askew for an opinion concerning radiation therapy as part of management of what appears to be advanced bladder cancer. Patient presented to the emergency room earlier this week with severe left hip pain. He was unable to walk in light of his pain severity. Prior to admission and prior to his new pain onset the patient was residing in an assisted living facility and was able to ambulate with these assistance of a walker. Patient underwent workup for his left hip pain. CT scan was inconclusive but MRI of the pelvis showed a large expansile mass along the left iliac bone. There is also appeared to be tumor in the right side of the bladder.  with this information radiation therapy is been consulted for consideration for palliative treatment.  PREVIOUS RADIATION THERAPY: No  PAST MEDICAL HISTORY:  has a past medical history of Anemia; Urinary retention; CHF (congestive heart failure); Pulmonary hypertension; Seizure; Hypernatremia; Aspiration into airway; Lung injury; Pneumonitis; and Fall.    PAST SURGICAL HISTORY:History reviewed. No pertinent past surgical history.  FAMILY HISTORY: family history is not on file.  SOCIAL HISTORY:  reports that he has never smoked. He has never used smokeless tobacco. He reports that he does not drink alcohol or use illicit drugs.  ALLERGIES: Hydrochlorothiazide  MEDICATIONS:  No current facility-administered medications for this encounter.   No current outpatient prescriptions on file.    Facility-Administered Medications Ordered in Other Encounters  Medication Dose Route Frequency Provider Last Rate Last Dose  . 0.9 %  sodium chloride infusion   Intravenous Continuous Allyne Gee, MD 50 mL/hr at 12/17/13 0200    . acetaminophen (TYLENOL) tablet 650 mg  650 mg Oral Q6H PRN Allyne Gee, MD       Or  . acetaminophen (TYLENOL) suppository 650 mg  650 mg Rectal Q6H PRN Allyne Gee, MD      . atorvastatin (LIPITOR) tablet 10 mg  10 mg Oral QHS Allyne Gee, MD   10 mg at 12/17/13 0130  . bisacodyl (DULCOLAX) suppository 10 mg  10 mg Rectal Daily PRN Allyne Gee, MD      . calcium carbonate (OS-CAL - dosed in mg of elemental calcium) tablet 500 mg of elemental calcium  1 tablet Oral Q breakfast Allyne Gee, MD   500 mg of elemental calcium at 12/17/13 0808  . cholecalciferol (VITAMIN D) tablet 800 Units  800 Units Oral Q breakfast Allyne Gee, MD   800 Units at 12/17/13 [phone]  . donepezil (ARICEPT) tablet 5 mg  5 mg Oral QHS Allyne Gee, MD   5 mg at 12/17/13 0130  . feeding supplement (ENSURE COMPLETE) (ENSURE COMPLETE) liquid 237 mL  237 mL Oral TID Allyne Gee, MD   237 mL at 12/17/13 0957  . finasteride (PROSCAR) tablet 5 mg  5 mg Oral Q breakfast Allyne Gee, MD   5 mg at 12/17/13 0953  . folic acid (FOLVITE) tablet 1 mg  1 mg Oral Daily Allyne Gee, MD  1 mg at 12/17/13 0957  . hydrALAZINE (APRESOLINE) tablet 25 mg  25 mg Oral TID Allyne Gee, MD   25 mg at 12/17/13 0957  . levothyroxine (SYNTHROID, LEVOTHROID) tablet 88 mcg  88 mcg Oral QAC breakfast Allyne Gee, MD   88 mcg at 12/17/13 0808  . [START ON 12/22/2013] mineral oil liquid 5 mL  5 mL Otic Weekly Allyne Gee, MD      . morphine 2 MG/ML injection 1 mg  1 mg Intravenous Q4H PRN Allyne Gee, MD   1 mg at 12/17/13 0400  . multivitamin with minerals tablet 1 tablet  1 tablet Oral Daily Allyne Gee, MD   1 tablet at 12/17/13 0957  . naproxen (NAPROSYN) tablet 250 mg  250 mg Oral BID WC Allyne Gee, MD   250 mg at 12/17/13 2836  . nystatin (MYCOSTATIN/NYSTOP) topical powder 1 g  1 g Topical Daily Allyne Gee, MD   1 g at 12/17/13 1003  . ondansetron (ZOFRAN) tablet 4 mg  4 mg Oral Q6H PRN Allyne Gee, MD       Or  . ondansetron [facility]) injection 4 mg  4 mg Intravenous Q6H PRN Allyne Gee, MD      . oxyCODONE (Oxy IR/ROXICODONE) immediate release tablet 5 mg  5 mg Oral Q4H PRN Allyne Gee, MD      . polyethylene glycol (MIRALAX / GLYCOLAX) packet 17 g  17 g Oral Daily Allyne Gee, MD   17 g at 12/17/13 0957  . psyllium (HYDROCIL/METAMUCIL) packet 1 packet  1 packet Oral QHS Allyne Gee, MD   1 packet at 12/17/13 0130  . sodium phosphate (FLEET) 7-19 GM/118ML enema 1 enema  1 enema Rectal Once PRN Allyne Gee, MD      . tamsulosin (FLOMAX) capsule 0.4 mg  0.4 mg Oral Q breakfast Allyne Gee, MD   0.4 mg at 12/17/13 0809  . thiamine (VITAMIN B-1) tablet 100 mg  100 mg Oral Daily Allyne Gee, MD   100 mg at 12/17/13 0957    REVIEW OF SYSTEMS:  A 15 point review of systems is documented in the electronic medical record. This was obtained by the nursing staff. However, I reviewed this with the patient to discuss relevant findings and make appropriate changes.  The patient complains of intense pain in the left pelvis quickly with movement and with an attempt at standing. Patient has been unable to stand recently in light of his pain severity. He denies any numbness or tingling in his left lower extremity or  focal weakness. Patient has had recent difficulties with urination. According to nursing staff blood has been noted in his urine earlier today. Patient denies any other areas of pain other than left pelvis. Patient has had some weight loss and his appetite has been poor most recently.   PHYSICAL EXAM:  Vitals - 1 value per visit 62/94/7654  SYSTOLIC 650  DIASTOLIC 62  Pulse 72  Temperature 97.8  Respirations 18  Weight (lb) 134.26  Height 5\' 7"   BMI 21.02   This  is a very pleasant somewhat hard of hearing 80 year old, no acute distress. He is lying in his hospital bed. Patient responds appropriately to questions. The extraocular eye movements are intact. The tongue is midline. No palpable cervical supraclavicular or axillary adenopathy. The lungs are clear to auscultation. The heart has a regular rhythm and rate. The abdomen is soft  and nontender with normal bowel sounds. Examination of the  extremities reveals peripheral pulses to be intact. Motor strength appears adequate in all 4 extremities although the patient experiences pain when moving his left leg.   ECOG = 4  4 - Bedbound (Completely disabled. Cannot carry on any self-care. Totally confined to bed or chair)  LABORATORY DATA:  Lab Results  Component Value Date   WBC 8.4 12/17/2013   HGB 9.5* 12/17/2013   HCT 29.2* 12/17/2013   MCV 85.9 12/17/2013   PLT 240 12/17/2013   NEUTROABS 4.3 01/07/2007   Lab Results  Component Value Date   NA 133* 12/17/2013   K 4.3 12/17/2013   CL 97 12/17/2013   CO2 25 12/17/2013   GLUCOSE 107* 12/17/2013   CREATININE 1.07 12/17/2013   CALCIUM 8.4 12/17/2013      RADIOGRAPHY: Dg Lumbar Spine Complete  12/16/2013   CLINICAL DATA:  Left hip pain, low back pain.  No known injury.  EXAM: LUMBAR SPINE - COMPLETE 4+ VIEW  COMPARISON:  CT of the abdomen and pelvis 05/07/2006  FINDINGS: Diffuse osteopenia. Postoperative changes in the lumbar spine. Normal alignment. No fracture. Mild degenerative facet disease throughout the lumbar spine. Early degenerative spurring anteriorly.  Diffuse aortic calcifications without visible aneurysm.  IMPRESSION: No acute bony abnormality.   Electronically Signed   By: Rolm Baptise M.D.   On: 12/16/2013 13:39   Dg Hip Complete Left  12/16/2013   CLINICAL DATA:  Left hip pain without injury.  EXAM: LEFT HIP - COMPLETE 2+ VIEW  COMPARISON:  None.  FINDINGS: There is no evidence of hip fracture or dislocation. There is no evidence  of arthropathy or other focal bone abnormality.  IMPRESSION: Normal left hip.   Electronically Signed   By: Sabino Dick M.D.   On: 12/16/2013 13:41   Mr Hip Left W Wo Contrast  12/16/2013   CLINICAL DATA:  Left hip pain for 2 weeks increasing with weight-bearing. CT scan revealed a mass in the left iliac bone.  EXAM: MRI OF THE LEFT HIP WITHOUT AND WITH CONTRAST  TECHNIQUE: Multiplanar, multisequence MR imaging was performed both before and after administration of intravenous contrast.  CONTRAST:  73mL MULTIHANCE GADOBENATE DIMEGLUMINE 529 MG/ML IV SOLN  COMPARISON:  12/16/2013  FINDINGS: Extensive abnormal marrow infiltration with edema and enhancement along with a lytic expansile lesion of the left iliac bone measuring 6.7 by 2.8 by 5.6 cm, with a small amount of extraosseous spread tracking deep to the posterior portion of the gluteus minimus and possibly deep to the iliacus muscle. No sciatic notch impingement or direct impingement on the sciatic nerve. No obturator impingement due to this process.  There is a right hydroureter extending down to the UVJ. In the vicinity of the right UVJ, there is a polypoid 2.6 cm enhancing mass sitting atop a sessile 6.1 by 3.4 by 1.8 cm bladder mass along the urothelium. I do not see a similar filling defect in the visualized portion of the right ureter itself.  There is abnormal edema tracking through the soft tissues below the right ischium to the skin surface. No proximal femoral lesion is observed.  IMPRESSION:  1. Expansile lytic left iliac bone lesion with early extraosseous spread posteriorly and anteriorly, extending to the superior acetabular margin. No hip effusion or synovial enhancement in the hip joint. Appearance compatible with a metastatic lesion given the presence of the obvious tumor in the right-sided the urinary bladder is with sessile and polypoid components  favoring transitional cell carcinoma. Conceivably this could represent superimposed myeloma or  fibrous dysplasia but a metastatic lesion is strongly favored. I do not see an other bony lesion in the pelvis or proximal femurs.   Electronically Signed   By: Sherryl Barters M.D.   On: 12/16/2013 20:32   Ct Hip Left Wo Contrast  12/16/2013   CLINICAL DATA:  LEFT leg pain. Pain extending from hip to the foot. Initial encounter. 8/10 hip pain.  EXAM: CT OF THE LEFT HIP WITHOUT CONTRAST  TECHNIQUE: Multidetector CT imaging of the left hip was performed according to the standard protocol. Multiplanar CT image reconstructions were also generated.  COMPARISON:  Radiographs earlier today.  FINDINGS: There is no hip fracture. Severe atherosclerosis. The LEFT obturator ring appears intact. The hip is mildly flexed. No hip effusion is evident.  Partial visualization of the anatomic pelvis shows an exophytic nodule in the bladder base, with some calcifications. Bladder tumor such as transitional cell carcinoma cannot be excluded. Followup urology consultation should be considered.  There is soft tissue attenuation in the marrow space of the LEFT supra-acetabular ilium, along with . Although this could represent active red marrow and superimposed osteopenia, on osteolytic lesion cannot be excluded. This area is only partially visible. If the patient is a candidate for MRI, that would be the preferred modality for further evaluation with and without contrast. Notably, marrow attenuation in other osteopenic bones appears normal and fatty.  IMPRESSION: 1. No acute osseous abnormality.  Negative for hip fracture. 2. Abnormal LEFT supra-acetabular ilium marrow space, suspicious for an osteolytic lesion although osteopenia and superimposed active red marrow could mimic this appearance. Followup MRI with and without contrast recommended if there are no contraindications. 3. Calcified nodule in the bladder base is partially visible. Followup urology consultation recommended.   Electronically Signed   By: Dereck Ligas M.D.    On: 12/16/2013 14:55      IMPRESSION: Probable stage IV bladder cancer. The patient will need to be seen by urology with cystoscopy and biopsy. Patient may require bladder catheterization in light of his urination difficulties. The patient would be a good candidate for palliative radiation therapy directed at the left pelvis area. Depending on urology plans the patient may also be a candidate for palliative radiation therapy directed at the bladder region. Today I discussed palliative radiation therapy directed at the left pelvis with the patient. He appears to understand the course of treatment side effects and potential benefits as well as toxicities. He wishes to proceed with initial planning but understands the radiation therapy will be held until tissue confirmation of malignancy.  PLAN: Simulation and planning for treatments directed at the left iliac bone region. Anticipate 10 treatments (2 weeks) directed at this area.     ------------------------------------------------  Blair Promise, PhD, MD

## 2013-12-17 NOTE — Progress Notes (Signed)
Patient ID: William Baker, male   DOB: September 10, 1925, 80 y.o.   MRN: [id number]  [facility] PROGRESS NOTE  William Baker PPJ:[id number] DOB: [birth year] DOA: 12/16/2013 PCP: No primary provider on file.  Brief narrative: Pt is 80 yo male who presented to [facility] ED with main concern of several weeks duration of progressively worsening left hip pain, throbbing and sharp, 10/10 in severity, radiating to the left knee, unable to bear weight, has been mostly bed bound as a result. Pt denies similar events in the past, no traumas to the area, no falls. Per NH records, pt has been ambulating with walker at baseline. No reported fevers and chills, no chest pain or shortness of breath.  In emergency department, patient noted to be hemodynamically stable, vital signs stable hip x-ray with no acute bony abnormalities, left hip MRI notable for expansile lytic left iliac bone lesion worrisome for metastatic lesion given the presence of obvious tumor in the right sided urinary bladder. Urology consulted and tried hospitalist asked to admit for further evaluation.  Assessment and Plan:    Principal Problem:   Acute left hip pain - Most likely secondary to lytic lesion, worrisome for metastatic lesion in the setting off what appears to be urinary bladder cancer - Urologist consulted, radiation oncology consulted as well - Provide analgesia as needed for symptom management Active Problems:   Metastatic urinary bladder cancer - New diagnosis for patient - Urology consulted for further assistance - Provided emotional support   Hypothyroidism - Continue Synthroid   HYPERTENSION, BENIGN SYSTEMIC - Reasonable inpatient control - Continue hydralazine   Hyponatremia - Secondary to prerenal etiology, provide IV fluids and repeat BMP in the morning - Hold Lasix   Acute blood loss anemia - Drop in hemoglobin since admission, certainly worrisome for bladder cancer being the source - Repeat CBC in the morning  Hyperlipidemia - Continue statin   Moderate protein calorie malnutrition  - Likely secondary to metastatic bladder cancer - Advance diet as patient unable to tolerate  DVT prophylaxis  Change heparin to SCDs  Code Status: Full Family Communication: Pt at bedside Disposition Plan: Home when medically stable  IV Access:   Peripheral IV Procedures and diagnostic studies:   Dg Lumbar Spine Complete 12/16/2013   No acute bony abnormality.    Dg Hip Complete Left  12/16/2013  Normal left hip.    Mr Hip Left W Wo Contrast  12/16/2013   1. Expansile lytic left iliac bone lesion with early extraosseous spread posteriorly and anteriorly, extending to the superior acetabular margin. No hip effusion or synovial enhancement in the hip joint. Appearance compatible with a metastatic lesion given the presence of the obvious tumor in the right-sided the urinary bladder is with sessile and polypoid components favoring transitional cell carcinoma. Conceivably this could represent superimposed myeloma or fibrous dysplasia but a metastatic lesion is strongly favored. I do not see an other bony lesion in the pelvis or proximal femurs.     Ct Hip Left Wo Contrast  12/16/2013   1. No acute osseous abnormality.  Negative for hip fracture.  2. Abnormal LEFT supra-acetabular ilium marrow space, suspicious for an osteolytic lesion although osteopenia and superimposed active red marrow could mimic this appearance. 3. Calcified nodule in the bladder base is partially visible. Followup urology consultation recommended.   Medical Consultants:   Urology Radiation oncology  Other Consultants:   Physical therapy  Anti-Infectives:   None  William Ramsay, MD  [facility] Pager [phone]  If 7PM-7AM, please contact night-coverage [email or web address] Password [email or web address] 12/17/2013, 9:22 AM   LOS: 1 day   HPI/Subjective: No events overnight.   Objective: Filed Vitals:   12/16/13 2330 12/17/13 0000 12/17/13 0040 12/17/13  0413  BP: 135/57 105/42 157/57 125/75  Pulse:  72 70 63  Temp:   98.1 F (36.7 C) 97.8 F (36.6 C)  TempSrc:   Oral Oral  Resp:  15 20 20   Height:   5\' 7"  (1.702 m)   Weight:   60.9 kg (134 lb 4.2 oz)   SpO2:  96% 97% 94%    Intake/Output Summary (Last 24 hours) at 12/17/13 8828 Last data filed at 12/17/13 0100  Gross per 24 hour  Intake      0 ml  Output    600 ml  Net   -600 ml    Exam:   General:  Pt is alert, follows commands appropriately, not in acute distress  Cardiovascular: Regular rate and rhythm, no rubs, no gallops  Respiratory: Clear to auscultation bilaterally, no wheezing, no crackles, no rhonchi  Abdomen: Soft, non tender, non distended, bowel sounds present, no guarding  Extremities: Tenderness to palpation over the left hip area, pulses DP and PT palpable bilaterally  Neuro: Grossly nonfocal  Data Reviewed: Basic Metabolic Panel:  Recent Labs Lab 12/16/13 1555 12/16/13 2223 12/17/13 0359  NA 132* 132* 133*  K 4.4 4.3 4.3  CL 97 95* 97  CO2  --  25 25  GLUCOSE 100* 96 107*  BUN 37* 30* 31*  CREATININE 1.10 1.02 1.07  CALCIUM  --  8.5 8.4   Liver Function Tests:  Recent Labs Lab 12/17/13 0359  AST 18  ALT 9  ALKPHOS 109  BILITOT 0.4  PROT 6.8  ALBUMIN 3.0*   CBC:  Recent Labs Lab 12/16/13 1555 12/16/13 2223 12/17/13 0359  WBC  --  8.2 8.4  HGB 11.2* 10.2* 9.5*  HCT 33.0* 30.2* 29.2*  MCV  --  84.4 85.9  PLT  --  258 240   CBG:  Recent Labs Lab 12/17/13 0746  GLUCAP 106*   Scheduled Meds: . atorvastatin  10 mg Oral QHS  . cholecalciferol  800 Units Oral Q breakfast  . donepezil  5 mg Oral QHS  . finasteride  5 mg Oral Q breakfast  . folic acid  1 mg Oral Daily  . furosemide  40 mg Oral Q breakfast  . heparin  5,000 Units Subcutaneous 3 times per day  . hydrALAZINE  25 mg Oral TID  . levothyroxine  88 mcg Oral QAC breakfast  . naproxen  250 mg Oral BID WC  . nystatin  1 g Topical Daily  . polyethylene  glycol  17 g Oral Daily  . psyllium  1 packet Oral QHS  . tamsulosin  0.4 mg Oral Q breakfast  . thiamine  100 mg Oral Daily   Continuous Infusions: . sodium chloride 50 mL/hr at 12/17/13 0200

## 2013-12-17 NOTE — Progress Notes (Signed)
Patient feels that he needs to urinate but only goes small amounts. Bladder scanned- 521ml.  Urine is also blood tinge with small clots present.  Dr. Doyle Askew is aware of this and has consulted with urology.

## 2013-12-17 NOTE — Progress Notes (Addendum)
Clinical Social Work Department BRIEF PSYCHOSOCIAL ASSESSMENT 12/17/2013  Patient:  William Baker, William Baker     Account Number:  [id number]     Admit date:  12/16/2013  Clinical Social Worker:  Glorious Peach, CLINICAL SOCIAL WORKER  Date/Time:  12/17/2013 04:00 PM  Referred by:  Physician  Date Referred:  12/17/2013 Referred for  ALF Placement   Other Referral:   Interview type:  Patient Other interview type:   William Baker (ALF-[facility])    PSYCHOSOCIAL DATA Living Status:  FACILITY Admitted from facility:  [facility] Level of care:  Assisted Living Primary support name:  William Baker Primary support relationship to patient:  S[place]LING Degree of support available:   Adequate    CURRENT CONCERNS Current Concerns  Post-Acute Placement   Other Concerns:    SOCIAL WORK ASSESSMENT / PLAN CSW received consult due to patient being admitted from an assisted living facility. CSW reviewed chart and contacted ALF ([facility]) to get a better intake on patient. CSW completed FL2 which is in patient's chart.   Assessment/plan status:  Psychosocial Support/Ongoing Assessment of Needs Other assessment/ plan:   Information/referral to community resources:  ALF vs SNF  PATIENT'S/FAMILY'S RESPONSE TO PLAN OF CARE: CSW met with patient at bedside. Patient at the time was a little sleepy. CSW confirmed with patient that he was in fact from [facility] before being admitted to the hospital. [place] consulted [place] who assures CSW that the patient has been a long term resident for 8 years and has a brother William Baker) who is involved in the patient's life. ALF also states that the patient is pretty isolated and needs encouragement to complete daily activities such as bathing and dressing. ALF also states that the patient is fairly independent. Patient needs assistance when bathing but is independent enough to dress himself. Patient self propelled himself in a wheelchair and  was able to transfer on his own prior to admission. Patient admitted for hip and leg pain. PT ordered at ALF but have not evaluated patient prior to admissiont. CSW will continue to follow.       [place] Intern    Reviewed by: Sindy Messing, LCSW

## 2013-12-17 NOTE — Consult Note (Signed)
Urology Consult   Reason for consult: Bladder mass  History of Present Illness: 80 y.o. male who is seen for consultation regarding likely new diagnosis of bladder cancer. Patient presented to the emergency room with severe left hip pain and imaging showed likely bladder mass in the setting of a large mass along the left iliac bone concerning for metastatic cancer.   He is currently experiecing gross hematuria that he reports for the past week.  Also has some bladder discomfort and associated pain when he urinates.  He currently has a condom catheter on and most recent PVR = 543ml.  Urine is red without clot per report.  No urine in the collection bag at the time of exam.    He denies any known history of bladder cancer.  He has had some difficulty voiding and is on medical management for BPH.  He thinks that he saw a Dealer during a prior hospitalization a few years ago.  He denies having a Dealer now.    He has a remote smoking history and denies other tobacco use.   Past Medical History  Diagnosis Date  . Anemia   . Urinary retention   . CHF (congestive heart failure)   . Pulmonary hypertension   . Seizure   . Hypernatremia   . Aspiration into airway   . Lung injury   . Pneumonitis   . Fall    History reviewed. No pertinent past surgical history.  Medications:  . atorvastatin  10 mg Oral QHS  . calcium carbonate  1 tablet Oral Q breakfast  . cholecalciferol  800 Units Oral Q breakfast  . donepezil  5 mg Oral QHS  . feeding supplement (ENSURE COMPLETE)  237 mL Oral TID  . finasteride  5 mg Oral Q breakfast  . folic acid  1 mg Oral Daily  . hydrALAZINE  25 mg Oral TID  . levothyroxine  88 mcg Oral QAC breakfast  . [START ON 12/22/2013] mineral oil  5 mL Otic Weekly  . multivitamin with minerals  1 tablet Oral Daily  . naproxen  250 mg Oral BID WC  . nystatin  1 g Topical Daily  . polyethylene glycol  17 g Oral Daily  . psyllium  1 packet Oral QHS  . tamsulosin  0.4  mg Oral Q breakfast  . thiamine  100 mg Oral Daily  acetaminophen, acetaminophen, bisacodyl, morphine injection, ondansetron (ZOFRAN) IV, ondansetron, oxyCODONE, sodium phosphate  Allergies:  Allergies  Allergen Reactions  . Hydrochlorothiazide Other (See Comments)    Hyponatremia   History   Social History  . Marital Status: Single    Spouse Name: N/A    Number of Children: N/A  . Years of Education: N/A   Occupational History  . Not on file.   Social History Main Topics  . Smoking status: Never Smoker   . Smokeless tobacco: Never Used  . Alcohol Use: No  . Drug Use: No  . Sexual Activity: Not on file   Other Topics Concern  . Not on file   Social History Narrative  . No narrative on file   History reviewed. No pertinent family history.  ROS: A complete review of systems was performed.  All systems are negative except for pertinent findings as noted.  Physical Exam:  Vital signs in last 24 hours: Filed Vitals:   12/17/13 0413 12/17/13 0957 12/17/13 1309 12/17/13 1517  BP: 125/75 126/53 173/62 150/72  Pulse: 63  72   Temp: 97.8 F (  36.6 C)  97.8 F (36.6 C)   TempSrc: Oral  Oral   Resp: 20  18   Height:      Weight:      SpO2: 94%  94%    Constitutional:  Alert and oriented, No acute distress Cardiovascular: Regular rate and rhythm, No JVD Respiratory: Normal respiratory effort, Lungs clear bilaterally GI: Abdomen is soft, tender over lower abdomen, nondistended, no abdominal masses Genitourinary: No CVAT. Normal male phallus, testes are descended bilaterally and non-tender and without masses, scrotum is normal in appearance without lesions or masses, perineum is normal on inspection.  Condom catheter in place. Lymphatic: No lymphadenopathy Neurologic: Grossly intact, no focal deficits Psychiatric: Normal mood and affect  Impression/Recommendation 80 yo with likely bladder mass in the setting of incomplete emptying, hematuria and possible metastatic  lesion in his hip. We had a long conversation with the patient regarding the findings on his imaging and the likelihood of bladder malignancy in the setting of hematuria.  Recommend transurethral resection of bladder tumor for both tissue diagnosis and management of hematuria.  Also recommend further imaging for staging and to evaluate kidneys / ureteral obstruction.  1) CT Urogram for staging and further evaluation of upper tracts.   2) Cathed urine sample for urinalysis and culture 3) Will schedule for cystoscopy and transurethral resection of bladder tumor 4) NPO tonight for procedure tomorrow    Attending attestation: Patient was seen and examined.  He does have some mild gross hematuria that seemed to be voiding okay.  His bladder was not distended on exam.  I reviewed the MRI of the hip and the CT images.  I'm not certain the left iliac bone would be related to the right bladder tumor because I don't appreciate any local advancement of the bladder tumoror any lymphadenopathy.  However his abdomen and pelvis has not been completely staged.  The need to make sure he doesn't have any renal masses as well as any disease in the chest.  I discussed the patient with Dr. Baltazar Najjar and agree with his assessment and plan.  I discussed the patient with Dr. Doyle Askew and Dr. Jana Hakim. I discussed with [name] (over phone) the nature, potential benefits, risks and alternatives to TURBT possible right ureteral stent, including side effects of the proposed treatment, the likelihood of the patient achieving the goals of the procedure, and any potential problems that might occur during the procedure or recuperation. All questions answered. Patient elects to proceed.

## 2013-12-17 NOTE — Progress Notes (Signed)
INITIAL NUTRITION ASSESSMENT  DOCUMENTATION CODES Per approved criteria  -Non-severe (moderate) malnutrition in the context of chronic illness  Pt meets criteria for moderate MALNUTRITION in the context of chronic illness as evidenced by PO intake < 75% for one month and moderate muscle wasting and subcutaneous fat loss in temporal regions, thigh, calf and patellar regions  INTERVENTION: -Recommend Ensure Complete po TID, each supplement provides 350 kcal and 13 grams of protein -Consider liberalizing diet to encourage PO intake if decreases to < 75% meal completion -RD to continue to monitor   NUTRITION DIAGNOSIS: Increased nutrient needs (protien/kcal) related to chronic disease as evidenced by newly dx bladder cancer.   Goal: Pt to meet >/= 90% of their estimated nutrition needs    Monitor:  Total protein/energy intake, labs, weights, diet order  Reason for Assessment: MST  80 y.o. male  Admitting Dx: Hip pain  ASSESSMENT: Pt is 80 yo male who presented to [facility] ED with main concern of several weeks duration of progressively worsening left hip pain, throbbing and sharp, 10/10 in severity, radiating to the left knee, unable to bear weight, has been mostly bed bound as a result.  -Pt reported slight decrease in appetite pta. Diet recall indicates pt consuming 50% of three meals daily and Ensure Complete BID. Was unable to determine when decreased appetite began, likely in past several weeks d/t bedbound status, hip pain, and general early satiety r/t aging process -Endorsed recent weight gain of 5 lbs, usual body weight around 129 lbs -Current PO intake reported to be > 50%, consider liberalizing diet if meal completion decreases -MD noted pt with newly dx bladder cancer with possible mets to bone; continue with EC TID for additional nutrient replenishment  Height: Ht Readings from Last 1 Encounters:  12/17/13 5\' 7"  (1.702 m)    Weight: Wt Readings from Last 1 Encounters:   12/17/13 134 lb 4.2 oz (60.9 kg)    Ideal Body Weight: 148 lbs  % Ideal Body Weight: 91%  Wt Readings from Last 10 Encounters:  12/17/13 134 lb 4.2 oz (60.9 kg)  12/31/07 151 lb 9.6 oz (68.765 kg)  03/31/07 147 lb 6.4 oz (66.86 kg)  03/03/07 148 lb 3.2 oz (67.223 kg)  10/23/06 134 lb 14.4 oz (61.19 kg)    Usual Body Weight: 129 lb  % Usual Body Weight: 104%  BMI:  Body mass index is 21.02 kg/(m^2).  Estimated Nutritional Needs: Kcal: 1700-1900 Protein: 65-80 gram Fluid: >/= 1700 ml daily  Skin: WDL  Diet Order: Cardiac  EDUCATION NEEDS: -No education needs identified at this time   Intake/Output Summary (Last 24 hours) at 12/17/13 1300 Last data filed at 12/17/13 0100  Gross per 24 hour  Intake      0 ml  Output    600 ml  Net   -600 ml    Last BM: 10/28   Labs:   Recent Labs Lab 12/16/13 1555 12/16/13 2223 12/17/13 0359  NA 132* 132* 133*  K 4.4 4.3 4.3  CL 97 95* 97  CO2  --  25 25  BUN 37* 30* 31*  CREATININE 1.10 1.02 1.07  CALCIUM  --  8.5 8.4  GLUCOSE 100* 96 107*    CBG (last 3)   Recent Labs  12/17/13 0746  GLUCAP 106*    Scheduled Meds: . atorvastatin  10 mg Oral QHS  . calcium carbonate  1 tablet Oral Q breakfast  . cholecalciferol  800 Units Oral Q breakfast  .  donepezil  5 mg Oral QHS  . feeding supplement (ENSURE COMPLETE)  237 mL Oral TID  . finasteride  5 mg Oral Q breakfast  . folic acid  1 mg Oral Daily  . hydrALAZINE  25 mg Oral TID  . levothyroxine  88 mcg Oral QAC breakfast  . [START ON 12/22/2013] mineral oil  5 mL Otic Weekly  . multivitamin with minerals  1 tablet Oral Daily  . naproxen  250 mg Oral BID WC  . nystatin  1 g Topical Daily  . polyethylene glycol  17 g Oral Daily  . psyllium  1 packet Oral QHS  . tamsulosin  0.4 mg Oral Q breakfast  . thiamine  100 mg Oral Daily    Continuous Infusions: . sodium chloride 50 mL/hr at 12/17/13 0200    Past Medical History  Diagnosis Date  . Anemia   .  Urinary retention   . CHF (congestive heart failure)   . Pulmonary hypertension   . Seizure   . Hypernatremia   . Aspiration into airway   . Lung injury   . Pneumonitis   . Fall     History reviewed. No pertinent past surgical history.  Atlee Abide MS RD LDN Clinical Dietitian [phone]

## 2013-12-17 NOTE — Consult Note (Signed)
[place]  [phone] Fax:([phone]     ID: BAY WAYSON DOB: 1925/05/27  MR#: [id number]  KCL#:[id number]  Patient has no care team. PCP: No primary provider on file. GYN: SU:  OTHER MD:  CHIEF COMPLAINT: left hip pain and blood in the urine  CURRENT TREATMENT: diagnostic workup pending   HISTORY OF PRESENT ILLNESS Mr William Baker tells me he has been having worsening L hip pain for some weeks. This was treated symptomatically at his Assisted Living home, but as became more acute the patient presented to the ED 12/16/2013. L hip films showed no fracture and lumbar spine films showed only osteopenia, but a CT scan of the L hip showed a bladder mass and a possible lytic lesion in the L ilium. MRI of the Left hip showed "Extensive abnormal marrow infiltration with edema and enhancement along with a lytic expansile lesion of the left iliac bone measuring 6.7 by 2.8 by 5.6 cm, with a small amount of extraosseous spread tracking deep to the posterior portion of the gluteus minimus and possibly deep to the iliacus muscle." In addition there was a 6.1 cm bladder mass. Urology, radiation oncology and medical oncology have been consulted to advance the diagnostic workup and clarify prognosis and treatment options   INTERVAL HISTORY: I met with Mr William Baker in his hospital room 12/17/2013. There was no family present.  REVIEW OF SYSTEMS: The patient's pain is better controlled but "I can't walk." He denies, headaches, nausea or vomiting, visual changes, dizzyness, cough, phlegm production, or pleurisy. He is not aware of significant weight loss. He denies change in bowel habits.  PAST MEDICAL HISTORY: Past Medical History  Diagnosis Date  . Anemia   . Urinary retention   . CHF (congestive heart failure)   . Pulmonary hypertension   . Seizure   . Hypernatremia   . Aspiration into airway   . Lung injury   . Pneumonitis   . Fall     PAST SURGICAL  HISTORY: History reviewed. No pertinent past surgical history.  FAMILY HISTORY History reviewed. No pertinent family history. The patient's father died in his mid-seventies, possibly with ALZ. The patient's mother died in her mid-seventies from heart disease.One sister died from cancer, type unknown to the patient, in her 11's. The patient has one surviving brother.  SOCIAL HISTORY:  Mr William Baker is originally from [place]. He worked for a [employer] but has long been retired. He never married and has no children. He resides at [facility] in the Assisted Living section.     ADVANCED DIRECTIVES: the patient's brother William Baker is the only first degree relative.   HEALTH MAINTENANCE: History  Substance Use Topics  . Smoking status: Never Smoker   . Smokeless tobacco: Never Used  . Alcohol Use: No     Colonoscopy:  PSA:  Bone density:  Lipid panel:  Allergies  Allergen Reactions  . Hydrochlorothiazide Other (See Comments)    Hyponatremia    Current Facility-Administered Medications  Medication Dose Route Frequency Provider Last Rate Last Dose  . 0.9 %  sodium chloride infusion   Intravenous Continuous Allyne Gee, MD 50 mL/hr at 12/17/13 0200    . acetaminophen (TYLENOL) tablet 650 mg  650 mg Oral Q6H PRN Allyne Gee, MD       Or  . acetaminophen (TYLENOL) suppository 650 mg  650 mg Rectal Q6H PRN Allyne Gee, MD      . atorvastatin (LIPITOR) tablet  10 mg  10 mg Oral QHS Allyne Gee, MD   10 mg at 12/17/13 0130  . bisacodyl (DULCOLAX) suppository 10 mg  10 mg Rectal Daily PRN Allyne Gee, MD      . calcium carbonate (OS-CAL - dosed in mg of elemental calcium) tablet 500 mg of elemental calcium  1 tablet Oral Q breakfast Allyne Gee, MD   500 mg of elemental calcium at 12/17/13 0808  . cholecalciferol (VITAMIN D) tablet 800 Units  800 Units Oral Q breakfast Allyne Gee, MD   800 Units at 12/17/13 [phone]  . donepezil (ARICEPT) tablet 5 mg  5 mg Oral  QHS Allyne Gee, MD   5 mg at 12/17/13 0130  . feeding supplement (ENSURE COMPLETE) (ENSURE COMPLETE) liquid 237 mL  237 mL Oral TID Allyne Gee, MD   237 mL at 12/17/13 1517  . finasteride (PROSCAR) tablet 5 mg  5 mg Oral Q breakfast Allyne Gee, MD   5 mg at 12/17/13 0953  . folic acid (FOLVITE) tablet 1 mg  1 mg Oral Daily Allyne Gee, MD   1 mg at 12/17/13 0957  . hydrALAZINE (APRESOLINE) tablet 25 mg  25 mg Oral TID Allyne Gee, MD   25 mg at 12/17/13 1517  . levothyroxine (SYNTHROID, LEVOTHROID) tablet 88 mcg  88 mcg Oral QAC breakfast Allyne Gee, MD   88 mcg at 12/17/13 0808  . [START ON 12/22/2013] mineral oil liquid 5 mL  5 mL Otic Weekly Allyne Gee, MD      . morphine 2 MG/ML injection 1 mg  1 mg Intravenous Q4H PRN Allyne Gee, MD   1 mg at 12/17/13 0400  . multivitamin with minerals tablet 1 tablet  1 tablet Oral Daily Allyne Gee, MD   1 tablet at 12/17/13 0957  . naproxen (NAPROSYN) tablet 250 mg  250 mg Oral BID WC Allyne Gee, MD   250 mg at 12/17/13 1604  . nystatin (MYCOSTATIN/NYSTOP) topical powder 1 g  1 g Topical Daily Allyne Gee, MD   1 g at 12/17/13 1003  . ondansetron (ZOFRAN) tablet 4 mg  4 mg Oral Q6H PRN Allyne Gee, MD       Or  . ondansetron [facility]) injection 4 mg  4 mg Intravenous Q6H PRN Allyne Gee, MD      . oxyCODONE (Oxy IR/ROXICODONE) immediate release tablet 5 mg  5 mg Oral Q4H PRN Allyne Gee, MD      . polyethylene glycol (MIRALAX / GLYCOLAX) packet 17 g  17 g Oral Daily Allyne Gee, MD   17 g at 12/17/13 0957  . psyllium (HYDROCIL/METAMUCIL) packet 1 packet  1 packet Oral QHS Allyne Gee, MD   1 packet at 12/17/13 0130  . sodium phosphate (FLEET) 7-19 GM/118ML enema 1 enema  1 enema Rectal Once PRN Allyne Gee, MD      . tamsulosin (FLOMAX) capsule 0.4 mg  0.4 mg Oral Q breakfast Allyne Gee, MD   0.4 mg at 12/17/13 0809  . thiamine (VITAMIN B-1) tablet 100 mg  100 mg Oral Daily Allyne Gee, MD   100 mg at 12/17/13  0957    OBJECTIVE: elderly [place] man examined in bed Filed Vitals:   12/17/13 1517  BP: 150/72  Pulse:   Temp:   Resp:      Body mass index is 21.02 kg/(m^2).  ECOG FS:3 - Symptomatic, >50% confined to bed  Ocular: Sclerae unicteric, EOMs intact Lymphatic: No cervical or supraclavicular adenopathy Lungs no rales or rhonchi,  Heart regular rate and rhythm, Abd soft, nontender, increased bowel sounds MSK significant kyphosis Neuro: answers appropriately and is able to express his concerns (for instance that he is afraid people don't wake up sometimes when they are put to sleep for procedures); appropriate affect    LAB RESULTS:  CMP     Component Value Date/Time   NA 133* 12/17/2013 0359   K 4.3 12/17/2013 0359   CL 97 12/17/2013 0359   CO2 25 12/17/2013 0359   GLUCOSE 107* 12/17/2013 0359   BUN 31* 12/17/2013 0359   CREATININE 1.07 12/17/2013 0359   CALCIUM 8.4 12/17/2013 0359   PROT 6.8 12/17/2013 0359   ALBUMIN 3.0* 12/17/2013 0359   AST 18 12/17/2013 0359   ALT 9 12/17/2013 0359   ALKPHOS 109 12/17/2013 0359   BILITOT 0.4 12/17/2013 0359   GFRNONAA 61* 12/17/2013 0359   GFRAA 70* 12/17/2013 0359    I No results found for this basename: SPEP, UPEP,  kappa and lambda light chains    Lab Results  Component Value Date   WBC 8.4 12/17/2013   NEUTROABS 4.3 01/07/2007   HGB 9.5* 12/17/2013   HCT 29.2* 12/17/2013   MCV 85.9 12/17/2013   PLT 240 12/17/2013    _0 @  No results found for this basename: LABCA2    No components found with this basename: LABCA125    No results found for this basename: INR,  in the last 168 hours  Urinalysis    Component Value Date/Time   COLORURINE yellow 03/31/2007 0951   APPEARANCEUR Clear 03/31/2007 0951   LABSPEC 1.015 03/31/2007 0951   PHURINE 6.0 03/31/2007 0951   HGBUR trace-intact 03/31/2007 0951   BILIRUBINUR negative 03/31/2007 0951   UROBILINOGEN 0.2 03/31/2007 0951   NITRITE negative 03/31/2007 0951     STUDIES: Dg Lumbar Spine Complete  12/16/2013   CLINICAL DATA:  Left hip pain, low back pain.  No known injury.  EXAM: LUMBAR SPINE - COMPLETE 4+ VIEW  COMPARISON:  CT of the abdomen and pelvis 05/07/2006  FINDINGS: Diffuse osteopenia. Postoperative changes in the lumbar spine. Normal alignment. No fracture. Mild degenerative facet disease throughout the lumbar spine. Early degenerative spurring anteriorly.  Diffuse aortic calcifications without visible aneurysm.  IMPRESSION: No acute bony abnormality.   Electronically Signed   By: Rolm Baptise M.D.   On: 12/16/2013 13:39   Dg Hip Complete Left  12/16/2013   CLINICAL DATA:  Left hip pain without injury.  EXAM: LEFT HIP - COMPLETE 2+ VIEW  COMPARISON:  None.  FINDINGS: There is no evidence of hip fracture or dislocation. There is no evidence of arthropathy or other focal bone abnormality.  IMPRESSION: Normal left hip.   Electronically Signed   By: Sabino Dick M.D.   On: 12/16/2013 13:41   Mr Hip Left W Wo Contrast  12/16/2013   CLINICAL DATA:  Left hip pain for 2 weeks increasing with weight-bearing. CT scan revealed a mass in the left iliac bone.  EXAM: MRI OF THE LEFT HIP WITHOUT AND WITH CONTRAST  TECHNIQUE: Multiplanar, multisequence MR imaging was performed both before and after administration of intravenous contrast.  CONTRAST:  85m MULTIHANCE GADOBENATE DIMEGLUMINE 529 MG/ML IV SOLN  COMPARISON:  12/16/2013  FINDINGS: Extensive abnormal marrow infiltration with edema and enhancement along with a lytic expansile lesion of the left iliac  bone measuring 6.7 by 2.8 by 5.6 cm, with a small amount of extraosseous spread tracking deep to the posterior portion of the gluteus minimus and possibly deep to the iliacus muscle. No sciatic notch impingement or direct impingement on the sciatic nerve. No obturator impingement due to this process.  There is a right hydroureter extending down to the UVJ. In the vicinity of the right UVJ, there is a polypoid  2.6 cm enhancing mass sitting atop a sessile 6.1 by 3.4 by 1.8 cm bladder mass along the urothelium. I do not see a similar filling defect in the visualized portion of the right ureter itself.  There is abnormal edema tracking through the soft tissues below the right ischium to the skin surface. No proximal femoral lesion is observed.  IMPRESSION:  1. Expansile lytic left iliac bone lesion with early extraosseous spread posteriorly and anteriorly, extending to the superior acetabular margin. No hip effusion or synovial enhancement in the hip joint. Appearance compatible with a metastatic lesion given the presence of the obvious tumor in the right-sided the urinary bladder is with sessile and polypoid components favoring transitional cell carcinoma. Conceivably this could represent superimposed myeloma or fibrous dysplasia but a metastatic lesion is strongly favored. I do not see an other bony lesion in the pelvis or proximal femurs.   Electronically Signed   By: Sherryl Barters M.D.   On: 12/16/2013 20:32   Ct Hip Left Wo Contrast  12/16/2013   CLINICAL DATA:  LEFT leg pain. Pain extending from hip to the foot. Initial encounter. 8/10 hip pain.  EXAM: CT OF THE LEFT HIP WITHOUT CONTRAST  TECHNIQUE: Multidetector CT imaging of the left hip was performed according to the standard protocol. Multiplanar CT image reconstructions were also generated.  COMPARISON:  Radiographs earlier today.  FINDINGS: There is no hip fracture. Severe atherosclerosis. The LEFT obturator ring appears intact. The hip is mildly flexed. No hip effusion is evident.  Partial visualization of the anatomic pelvis shows an exophytic nodule in the bladder base, with some calcifications. Bladder tumor such as transitional cell carcinoma cannot be excluded. Followup urology consultation should be considered.  There is soft tissue attenuation in the marrow space of the LEFT supra-acetabular ilium, along with . Although this could represent active  red marrow and superimposed osteopenia, on osteolytic lesion cannot be excluded. This area is only partially visible. If the patient is a candidate for MRI, that would be the preferred modality for further evaluation with and without contrast. Notably, marrow attenuation in other osteopenic bones appears normal and fatty.  IMPRESSION: 1. No acute osseous abnormality.  Negative for hip fracture. 2. Abnormal LEFT supra-acetabular ilium marrow space, suspicious for an osteolytic lesion although osteopenia and superimposed active red marrow could mimic this appearance. Followup MRI with and without contrast recommended if there are no contraindications. 3. Calcified nodule in the bladder base is partially visible. Followup urology consultation recommended.   Electronically Signed   By: Dereck Ligas M.D.   On: 12/16/2013 14:55    ASSESSMENT: 81 y.o. [place] resident (Assisted Living) presenting with L hip pain and hematuria, scans so far showing a large lytic Left iliac lesion and a 6.1 cm bladder mass  PLAN: I met w Mr Lerew and discussed the situation with him at length. We need tissue confirmation and urology is planning TUR tomorrow after further scanning. The question is whether the L hip lesion could be unrelated to the bladder mass. If there are no other obvious  primaries (lung, renal) chief differential would be myeloma and I will obtain an SPEP and kappa/lmbda light chains. However it may be necessary to do an iliac bone biopsy for definitive diagnosis--will tentatively schedule that for NOV 2 if possible, so his XRT can proceed without undue delay  I also discussed with Mr Severs that once we know what cancer or cancers we are dealing with we can decided whether chemotherapy or other systemic treatment might be advisable. This would only be started after radiation has been completed. I will make Mr Stmartin an outpatient appointment  at the cancer center once we have tissue confirmation and we  will discuss the systemic treatment options, possible benefits, and possible side effects and complications, at that time.  Will follow with you.  Chauncey Cruel, MD   12/17/2013 6:59 PM

## 2013-12-17 NOTE — ED Notes (Signed)
Pt requested that his brother Modesto Ganoe [phone] be contacted and provided information about the pt's new diagnosis. Pt states that his brother lives in [place]. Provided contact information to Dr. Mingo Amber.

## 2013-12-18 ENCOUNTER — Other Ambulatory Visit: Payer: Self-pay | Admitting: Urology

## 2013-12-18 ENCOUNTER — Encounter (HOSPITAL_COMMUNITY)
Admission: EM | Disposition: A | Discharge status: Skilled Nursing Facility | Payer: Self-pay | Source: Home / Self Care | Attending: Internal Medicine

## 2013-12-18 ENCOUNTER — Inpatient Hospital Stay (HOSPITAL_COMMUNITY): Payer: Medicare HMO | Admitting: Anesthesiology

## 2013-12-18 ENCOUNTER — Encounter (HOSPITAL_COMMUNITY): Payer: Medicare HMO | Admitting: Anesthesiology

## 2013-12-18 ENCOUNTER — Encounter (HOSPITAL_COMMUNITY): Payer: Self-pay | Admitting: Anesthesiology

## 2013-12-18 HISTORY — PX: TRANSURETHRAL RESECTION OF BLADDER TUMOR: SHX2575

## 2013-12-18 LAB — BASIC METABOLIC PANEL
Anion gap: 10 (ref 5–15)
BUN: 23 mg/dL (ref 6–23)
CALCIUM: 8.3 mg/dL — AB (ref 8.4–10.5)
CO2: 25 mEq/L (ref 19–32)
CREATININE: 0.97 mg/dL (ref 0.50–1.35)
Chloride: 98 mEq/L (ref 96–112)
GFR calc Af Amer: 84 mL/min — ABNORMAL LOW (ref >90)
GFR calc non Af Amer: 73 mL/min — ABNORMAL LOW (ref >90)
GLUCOSE: 90 mg/dL (ref 70–99)
Potassium: 4.3 mEq/L (ref 3.7–5.3)
Sodium: 133 mEq/L — ABNORMAL LOW (ref 137–147)

## 2013-12-18 LAB — CBC
HCT: 27.8 % — ABNORMAL LOW (ref 39.0–52.0)
HEMOGLOBIN: 9.5 g/dL — AB (ref 13.0–17.0)
MCH: 29.1 pg (ref 26.0–34.0)
MCHC: 34.2 g/dL (ref 30.0–36.0)
MCV: 85 fL (ref 78.0–100.0)
Platelets: 258 10*3/uL (ref 150–400)
RBC: 3.27 MIL/uL — ABNORMAL LOW (ref 4.22–5.81)
RDW: 12.5 % (ref 11.5–15.5)
WBC: 7.6 10*3/uL (ref 4.0–10.5)

## 2013-12-18 LAB — SURGICAL PCR SCREEN
MRSA, PCR: NEGATIVE
STAPHYLOCOCCUS AUREUS: NEGATIVE

## 2013-12-18 LAB — GLUCOSE, CAPILLARY: Glucose-Capillary: 93 mg/dL (ref 70–99)

## 2013-12-18 LAB — CEA: CEA: 1.3 ng/mL (ref 0.0–5.0)

## 2013-12-18 SURGERY — TURBT (TRANSURETHRAL RESECTION OF BLADDER TUMOR)
Anesthesia: General | Site: Bladder

## 2013-12-18 MED ORDER — EPHEDRINE SULFATE 50 MG/ML IJ SOLN
INTRAMUSCULAR | Status: DC | PRN
Start: 2013-12-18 — End: 2013-12-18
  Administered 2013-12-18 (×3): 10 mg via INTRAVENOUS

## 2013-12-18 MED ORDER — LACTATED RINGERS IV SOLN
INTRAVENOUS | Status: DC | PRN
  Administered 2013-12-18: 17:00:00 via INTRAVENOUS

## 2013-12-18 MED ORDER — LIDOCAINE HCL (CARDIAC) 20 MG/ML IV SOLN
INTRAVENOUS | Status: AC
  Filled 2013-12-18: qty 5

## 2013-12-18 MED ORDER — PROPOFOL 10 MG/ML IV BOLUS
INTRAVENOUS | Status: DC | PRN
  Administered 2013-12-18: 20 mg via INTRAVENOUS
  Administered 2013-12-18: 150 mg via INTRAVENOUS
  Administered 2013-12-18: 30 mg via INTRAVENOUS

## 2013-12-18 MED ORDER — SODIUM CHLORIDE 0.9 % IR SOLN
Status: DC | PRN
  Administered 2013-12-18: 8000 mL

## 2013-12-18 MED ORDER — FENTANYL CITRATE 0.05 MG/ML IJ SOLN
25.0000 ug | INTRAMUSCULAR | Status: DC | PRN
  Administered 2013-12-18: 25 ug via INTRAVENOUS

## 2013-12-18 MED ORDER — DEXAMETHASONE SODIUM PHOSPHATE 10 MG/ML IJ SOLN
INTRAMUSCULAR | Status: DC | PRN
  Administered 2013-12-18: 10 mg via INTRAVENOUS

## 2013-12-18 MED ORDER — LIDOCAINE HCL (CARDIAC) 20 MG/ML IV SOLN
INTRAVENOUS | Status: DC | PRN
  Administered 2013-12-18: 100 mg via INTRAVENOUS

## 2013-12-18 MED ORDER — FENTANYL CITRATE 0.05 MG/ML IJ SOLN
INTRAMUSCULAR | Status: AC
  Administered 2013-12-18: 19:00:00
  Filled 2013-12-18: qty 2

## 2013-12-18 MED ORDER — ONDANSETRON HCL 4 MG/2ML IJ SOLN
INTRAMUSCULAR | Status: DC | PRN
  Administered 2013-12-18: 4 mg via INTRAVENOUS

## 2013-12-18 MED ORDER — CEFAZOLIN SODIUM-DEXTROSE 2-3 GM-% IV SOLR
INTRAVENOUS | Status: AC
  Filled 2013-12-18: qty 50

## 2013-12-18 MED ORDER — FENTANYL CITRATE 0.05 MG/ML IJ SOLN
INTRAMUSCULAR | Status: DC | PRN
  Administered 2013-12-18: 25 ug via INTRAVENOUS
  Administered 2013-12-18: 50 ug via INTRAVENOUS
  Administered 2013-12-18: 25 ug via INTRAVENOUS

## 2013-12-18 MED ORDER — ONDANSETRON HCL 4 MG/2ML IJ SOLN
INTRAMUSCULAR | Status: AC
  Filled 2013-12-18: qty 2

## 2013-12-18 MED ORDER — DEXAMETHASONE SODIUM PHOSPHATE 10 MG/ML IJ SOLN
INTRAMUSCULAR | Status: AC
  Filled 2013-12-18: qty 1

## 2013-12-18 MED ORDER — FENTANYL CITRATE 0.05 MG/ML IJ SOLN
INTRAMUSCULAR | Status: AC
  Filled 2013-12-18: qty 2

## 2013-12-18 MED ORDER — CEFAZOLIN SODIUM-DEXTROSE 2-3 GM-% IV SOLR
2.0000 g | INTRAVENOUS | Status: AC
  Administered 2013-12-18: 2 g via INTRAVENOUS
  Filled 2013-12-18: qty 50

## 2013-12-18 MED ORDER — PROPOFOL 10 MG/ML IV BOLUS
INTRAVENOUS | Status: AC
Start: 2013-12-18 — End: 2013-12-18
  Filled 2013-12-18: qty 20

## 2013-12-18 SURGICAL SUPPLY — 18 items
BAG URINE DRAINAGE (UROLOGICAL SUPPLIES) IMPLANT
BAG URO CATCHER STRL LF (DRAPE) ×3 IMPLANT
CATH FOLEY 2WAY SLVR 18FR 30CC (CATHETERS) ×3 IMPLANT
DRAPE CAMERA CLOSED 9X96 (DRAPES) ×3 IMPLANT
ELECT BUTTON HF 24-28F 2 30DE (ELECTRODE) ×3 IMPLANT
ELECT LOOP MED HF 24F 12D (CUTTING LOOP) ×3 IMPLANT
ELECT LOOP MED HF 24F 12D CBL (CLIP) ×3 IMPLANT
ELECT REM PT RETURN 9FT ADLT (ELECTROSURGICAL) ×3
ELECT RESECT VAPORIZE 12D CBL (ELECTRODE) IMPLANT
ELECTRODE REM PT RTRN 9FT ADLT (ELECTROSURGICAL) ×1 IMPLANT
GLOVE BIOGEL M STRL SZ7.5 (GLOVE) ×3 IMPLANT
GOWN STRL REUS W/TWL XL LVL3 (GOWN DISPOSABLE) ×9 IMPLANT
KIT ASPIRATION TUBING (SET/KITS/TRAYS/PACK) IMPLANT
MANIFOLD NEPTUNE II (INSTRUMENTS) ×3 IMPLANT
PACK CYSTO (CUSTOM PROCEDURE TRAY) ×3 IMPLANT
SYR 30ML LL (SYRINGE) ×3 IMPLANT
TUBING CONNECTING 10 (TUBING) ×2 IMPLANT
TUBING CONNECTING 10' (TUBING) ×1

## 2013-12-18 NOTE — Anesthesia Preprocedure Evaluation (Signed)
Anesthesia Evaluation  Patient identified by MRN, date of birth, ID band Patient awake    Reviewed: Allergy & Precautions, H&P , NPO status , Patient's Chart, lab work & pertinent test results  Airway Mallampati: II  TM Distance: >3 FB Neck ROM: Full    Dental no notable dental hx.    Pulmonary neg pulmonary ROS,  breath sounds clear to auscultation  Pulmonary exam normal       Cardiovascular hypertension, Pt. on medications +CHF Rhythm:Regular Rate:Normal     Neuro/Psych Seizures -,  negative psych ROS   GI/Hepatic negative GI ROS, Neg liver ROS,   Endo/Other  Hypothyroidism   Renal/GU negative Renal ROS  negative genitourinary   Musculoskeletal negative musculoskeletal ROS (+)   Abdominal   Peds negative pediatric ROS (+)  Hematology  (+) anemia ,   Anesthesia Other Findings   Reproductive/Obstetrics negative OB ROS                             Anesthesia Physical Anesthesia Plan  ASA: III  Anesthesia Plan: General   Post-op Pain Management:    Induction: Intravenous  Airway Management Planned: LMA  Additional Equipment:   Intra-op Plan:   Post-operative Plan: Extubation in OR  Informed Consent: I have reviewed the patients History and Physical, chart, labs and discussed the procedure including the risks, benefits and alternatives for the proposed anesthesia with the patient or authorized representative who has indicated his/her understanding and acceptance.   Dental advisory given  Plan Discussed with: CRNA  Anesthesia Plan Comments:         Anesthesia Quick Evaluation

## 2013-12-18 NOTE — Discharge Instructions (Signed)
Cystoscopy, Care After   Refer to this sheet in the next few weeks. These instructions provide you with information on caring for yourself after your procedure. Your caregiver may also give you more specific instructions. Your treatment has been planned according to current medical practices, but problems sometimes occur. Call your caregiver if you have any problems or questions after your procedure.   HOME CARE INSTRUCTIONS   Things you can do to ease any discomfort after your procedure include:   Drinking enough water and fluids to keep your urine clear or pale yellow.   Taking a warm bath to relieve any burning feelings.  SEEK IMMEDIATE MEDICAL CARE IF:   You have an increase in blood in your urine.   You notice blood clots in your urine.   You have difficulty passing urine.   You have the chills.   You have abdominal pain.   You have a fever or persistent symptoms for more than 2-3 days.   You have a fever and your symptoms suddenly get worse.  MAKE SURE YOU:   Understand these instructions.   Will watch your condition.   Will get help right away if you are not doing well or get worse.  Document Released: 08/25/2004 Document Revised: 10/08/2012 Document Reviewed: 07/30/2011   ExitCare® Patient Information ©2015 [employer]. This information is not intended to replace advice given to you by your health care provider. Make sure you discuss any questions you have with your health care provider.

## 2013-12-18 NOTE — Progress Notes (Signed)
Clinical Social Work  CSW spoke with attending MD who reports patient is not medically stable to DC and is still requiring further work-up. MD agreeable to order PT/OT to determine patient's level of care needed at DC. CSW met with patient at bedside who reports that he is agreeable for CSW to speak with his brother. CSW left a message with brother with CSW contact information and will continue to follow.  Sindy Messing, LCSW (Coverage for [employer])

## 2013-12-18 NOTE — Anesthesia Postprocedure Evaluation (Signed)
  Anesthesia Post-op Note  Patient: William Baker  Procedure(s) Performed: Procedure(s) (LRB): TRANSURETHRAL RESECTION OF BLADDER TUMOR (TURBT) (N/A)  Patient Location: PACU  Anesthesia Type: General  Level of Consciousness: awake and alert   Airway and Oxygen Therapy: Patient Spontanous Breathing  Post-op Pain: mild  Post-op Assessment: Post-op Vital signs reviewed, Patient's Cardiovascular Status Stable, Respiratory Function Stable, Patent Airway and No signs of Nausea or vomiting  Last Vitals:  Filed Vitals:   12/18/13 1930  BP: 173/71  Pulse: 75  Temp: 36.3 C  Resp: 15    Post-op Vital Signs: stable   Complications: No apparent anesthesia complications

## 2013-12-18 NOTE — Progress Notes (Signed)
Patient ID: William Baker, male   DOB: [birth year], 80 y.o.   MRN: [id number]  Patient without complaints this morning  Filed Vitals:   12/18/13 0449  BP: 158/63  Pulse: 62  Temp: 97.9 F (36.6 C)  Resp: 18    Intake/Output Summary (Last 24 hours) at 12/18/13 0950 Last data filed at 12/18/13 0700  Gross per 24 hour  Intake   1450 ml  Output   1925 ml  Net   -475 ml    PE: NAD Urine clear  CT A/P - I reviewed all the images.  He has another metastatic lesion and T11.  The bladder tumor is not only papillary but there is a more invasive component inferior to this that is causing significant right right hydronephrosis in the right kidney is atrophied.    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  Plan: Again I discussed with the patient and his brother the nature risks benefits and alternatives to TURBT.  I'm not certain I would leave a ureteral stent given the atrophy of the right kidney.  I'm not certain it would improve long-term kidney function, could lead to a lot of stent discomfort and would require changes in the operating room.  I discussed this with the patient.  I told the patient either myself or my partner Dr. Karsten Ro would perform the procedure later in the day.

## 2013-12-18 NOTE — Progress Notes (Signed)
Patient ID: William Baker, male   DOB: [birth year], 80 y.o.   MRN: [id number]  [facility] PROGRESS NOTE  William Baker:[id number] DOB: May 04, 1925 DOA: 12/16/2013 PCP: No primary provider on file.  Brief narrative:  Pt is 80 yo male who presented to [facility] ED with main concern of several weeks duration of progressively worsening left hip pain, throbbing and sharp, 10/10 in severity, radiating to the left knee, unable to bear weight, has been mostly bed bound as a result. Pt denies similar events in the past, no traumas to the area, no falls. Per NH records, pt has been ambulating with walker at baseline. No reported fevers and chills, no chest pain or shortness of breath.   In emergency department, patient noted to be hemodynamically stable, vital signs stable hip x-ray with no acute bony abnormalities, left hip MRI notable for expansile lytic left iliac bone lesion worrisome for metastatic lesion given the presence of obvious tumor in the right sided urinary bladder. Urology consulted and tried hospitalist asked to admit for further evaluation.   Assessment and Plan:   Principal Problem:  Acute left hip pain  - Most likely secondary to lytic lesion, worrisome for metastatic lesion in the setting off what appears to be urinary bladder cancer  - radiation oncology consulted for consideration of palliative radiation - Provide analgesia as needed for symptom management, per rec's anticipate 10 treatments (2 weeks) directed at this area - may be necessary to do an iliac bone biopsy for definitive diagnosis, tentatively schedule that for Nov 2nd, 2015 Active Problems:  Metastatic urinary bladder cancer  - New diagnosis for patient  - appreciate urology and oncology assistance  - TUR planned for this afternoon  Hypothyroidism  - Continue Synthroid  HYPERTENSION, BENIGN SYSTEMIC  - Reasonable inpatient control  - Continue hydralazine  Hyponatremia  - Secondary to prerenal etiology,  provide IV fluids and repeat BMP in the morning  - Hold Lasix  Acute blood loss anemia  - Drop in hemoglobin since admission, certainly worrisome for bladder cancer being the source  - Repeat CBC in the morning  Hyperlipidemia  - Continue statin  Moderate protein calorie malnutrition  - Likely secondary to metastatic bladder cancer  - Advance diet as patient unable to tolerate   DVT prophylaxis  SCD's  Code Status: Full  Family Communication: Pt at bedside  Disposition Plan: Home when medically stable   IV Access:   Peripheral IV Procedures and diagnostic studies:   Dg Lumbar Spine Complete 12/16/2013 No acute bony abnormality.  Dg Hip Complete Left 12/16/2013 Normal left hip.  Mr Hip Left W Wo Contrast 12/16/2013  1. Expansile lytic left iliac bone lesion with early extraosseous spread posteriorly and anteriorly, extending to the superior acetabular margin. No hip effusion or synovial enhancement in the hip joint. Appearance compatible with a metastatic lesion given the presence of the obvious tumor in the right-sided the urinary bladder is with sessile and polypoid components favoring transitional cell carcinoma. Conceivably this could represent superimposed myeloma or fibrous dysplasia but a metastatic lesion is strongly favored. I do not see an other bony lesion in the pelvis or proximal femurs.  Ct Hip Left Wo Contrast 12/16/2013  1. No acute osseous abnormality. Negative for hip fracture.  2. Abnormal LEFT supra-acetabular ilium marrow space, suspicious for an osteolytic lesion although osteopenia and superimposed active red marrow could mimic this appearance.  3. Calcified nodule in the bladder base is partially visible. Followup urology consultation recommended.  Medical Consultants:   Urology  Radiation oncology  Oncology  Other Consultants:   Physical therapy  Anti-Infectives:   None  Faye Ramsay, MD  [facility] Pager [phone]  If 7PM-7AM, please contact  night-coverage [email or web address] Password [email or web address] 12/18/2013, 7:52 AM   LOS: 2 days   HPI/Subjective: No events overnight.   Objective: Filed Vitals:   12/17/13 1309 12/17/13 1517 12/17/13 2239 12/18/13 0449  BP: 173/62 150/72 145/65 158/63  Pulse: 72  67 62  Temp: 97.8 F (36.6 C)  97.8 F (36.6 C) 97.9 F (36.6 C)  TempSrc: Oral  Oral Oral  Resp: 18  18 18   Height:      Weight:      SpO2: 94%  97% 97%    Intake/Output Summary (Last 24 hours) at 12/18/13 0752 Last data filed at 12/18/13 0700  Gross per 24 hour  Intake   1450 ml  Output   1925 ml  Net   -475 ml    Exam:   General:  Pt is alert, follows commands appropriately, not in acute distress  Cardiovascular: Regular rate and rhythm, no rubs, no gallops  Respiratory: Clear to auscultation bilaterally, no wheezing, no crackles, no rhonchi  Abdomen: Soft, non tender, non distended, bowel sounds present, no guarding  Data Reviewed: Basic Metabolic Panel:  Recent Labs Lab 12/16/13 1555 12/16/13 2223 12/17/13 0359 12/17/13 1907 12/18/13 0433  NA 132* 132* 133*  --  133*  K 4.4 4.3 4.3  --  4.3  CL 97 95* 97  --  98  CO2  --  25 25  --  25  GLUCOSE 100* 96 107*  --  90  BUN 37* 30* 31* 28* 23  CREATININE 1.10 1.02 1.07 0.96 0.97  CALCIUM  --  8.5 8.4  --  8.3*   Liver Function Tests:  Recent Labs Lab 12/17/13 0359  AST 18  ALT 9  ALKPHOS 109  BILITOT 0.4  PROT 6.8  ALBUMIN 3.0*   CBC:  Recent Labs Lab 12/16/13 1555 12/16/13 2223 12/17/13 0359 12/18/13 0433  WBC  --  8.2 8.4 7.6  HGB 11.2* 10.2* 9.5* 9.5*  HCT 33.0* 30.2* 29.2* 27.8*  MCV  --  84.4 85.9 85.0  PLT  --  258 240 258   CBG:  Recent Labs Lab 12/17/13 0746 12/18/13 0736  GLUCAP 106* 93   Scheduled Meds: . atorvastatin  10 mg Oral QHS  . calcium carbonate  1 tablet Oral Q breakfast  . cholecalciferol  800 Units Oral Q breakfast  . donepezil  5 mg Oral QHS  . feeding supplement (ENSURE COMPLETE)  237 mL Oral TID   . finasteride  5 mg Oral Q breakfast  . folic acid  1 mg Oral Daily  . hydrALAZINE  25 mg Oral TID  . levothyroxine  88 mcg Oral QAC breakfast  . [START ON 12/22/2013] mineral oil  5 mL Otic Weekly  . multivitamin with minerals  1 tablet Oral Daily  . naproxen  250 mg Oral BID WC  . nystatin  1 g Topical Daily  . polyethylene glycol  17 g Oral Daily  . psyllium  1 packet Oral QHS  . tamsulosin  0.4 mg Oral Q breakfast  . thiamine  100 mg Oral Daily   Continuous Infusions: . sodium chloride 50 mL/hr at 12/17/13 2229

## 2013-12-18 NOTE — Transfer of Care (Signed)
Immediate Anesthesia Transfer of Care Note  Patient: William Baker  Procedure(s) Performed: Procedure(s): TRANSURETHRAL RESECTION OF BLADDER TUMOR (TURBT) (N/A)  Patient Location: PACU  Anesthesia Type:General  Level of Consciousness: sedated  Airway & Oxygen Therapy: Patient Spontanous Breathing and Patient connected to face mask oxygen  Post-op Assessment: Report given to PACU RN and Post -op Vital signs reviewed and stable  Post vital signs: Reviewed and stable  Complications: No apparent anesthesia complications

## 2013-12-18 NOTE — Op Note (Signed)
Preoperative diagnosis: Bladder neoplasm, right hydronephrosis, bone metastasis Postoperative diagnosis: Same, phimosis   Procedure: Exam under anesthesia, TURBT greater than 5 cm  Surgeon: William Baker  Anesthesia: Denenny  Type of anesthesia: Gen.  Indications for procedure: William Baker is an 80 year old who presented with left hip pain. He is found to have an invasive bladder mass and metastatic disease in the left iliac bone and T11. He was brought for TURBT to consider palliative treatments.  Findings: On exam under anesthesia the prostate was small benign and flat. I didn't palpate a specific mass in the prostate or bladder but the exam was difficult. The patient had very limited hip abduction which made the exam difficult. There was phimosis. The testicles were sent bilaterally and palpably normal. On bimanual exam could not palpate a specific mass.  On cystoscopy the glans penis and foreskin on the inner surface appeared normal. The urethra was somewhat narrow but appeared normal. It appeared he may have had some chronic inflammation. Prostate was nonobstructive. In the bladder the left ureteral orifice was normal. The entire right trigone and out laterally was heaped up with an invasive mass and then there was a more extensive component out into the bladder lumen. This was not papillary that was actually a solid mass. In fact there was minimal bleeding when resected. I was not able to locate the right ureteral orifice. There were no other tumors in the bladder. There were no stones or foreign bodies in the bladder.  Description of procedure: After consent was obtained the patient brought to the operating room. After adequate anesthesia he was placed in lithotomy position. There is minimal hip abduction. An exam under anesthesia was performed. The patient was prepped and draped in the usual sterile fashion. Cystoscope was passed per urethra and the bladder carefully examined with the 12 and  70 lens. Scope was removed and the resectoscope sheath was passed with the visual obturator. The loop was then passed and the solid component of the tumor extending into the bladder lumen was shaved down even with the bladder wall. There was obvious invasion of most of the right trigone and right inferior bladder. All the chips were evacuated. There was excellent hemostasis at low pressure. The scope was removed and an 19 French Foley placed without difficulty. The patient was awakened taken recovery room in stable condition.  Complications: None  Blood loss: Minimal  Drains: 18 French Foley  Specimens to lab: Urine culture  Specimens to pathology: Bladder tumor  Disposition: Pt stable to PACU

## 2013-12-19 ENCOUNTER — Encounter (HOSPITAL_COMMUNITY): Payer: Self-pay | Admitting: Radiology

## 2013-12-19 LAB — CBC
HEMATOCRIT: 30.9 % — AB (ref 39.0–52.0)
Hemoglobin: 10.2 g/dL — ABNORMAL LOW (ref 13.0–17.0)
MCH: 28.4 pg (ref 26.0–34.0)
MCHC: 33 g/dL (ref 30.0–36.0)
MCV: 86.1 fL (ref 78.0–100.0)
Platelets: 251 10*3/uL (ref 150–400)
RBC: 3.59 MIL/uL — ABNORMAL LOW (ref 4.22–5.81)
RDW: 12.3 % (ref 11.5–15.5)
WBC: 11.3 10*3/uL — ABNORMAL HIGH (ref 4.0–10.5)

## 2013-12-19 LAB — URINE CULTURE

## 2013-12-19 LAB — BASIC METABOLIC PANEL
Anion gap: 13 (ref 5–15)
BUN: 23 mg/dL (ref 6–23)
CO2: 22 mEq/L (ref 19–32)
CREATININE: 0.83 mg/dL (ref 0.50–1.35)
Calcium: 8.7 mg/dL (ref 8.4–10.5)
Chloride: 96 mEq/L (ref 96–112)
GFR calc Af Amer: 90 mL/min — ABNORMAL LOW (ref >90)
GFR, EST NON AFRICAN AMERICAN: 77 mL/min — AB (ref >90)
Glucose, Bld: 149 mg/dL — ABNORMAL HIGH (ref 70–99)
Potassium: 4.6 mEq/L (ref 3.7–5.3)
Sodium: 131 mEq/L — ABNORMAL LOW (ref 137–147)

## 2013-12-19 LAB — GLUCOSE, CAPILLARY: Glucose-Capillary: 129 mg/dL — ABNORMAL HIGH (ref 70–99)

## 2013-12-19 MED ORDER — HYOSCYAMINE SULFATE 0.125 MG SL SUBL
0.2500 mg | SUBLINGUAL_TABLET | SUBLINGUAL | Status: DC | PRN
  Filled 2013-12-19: qty 2

## 2013-12-19 NOTE — H&P (Signed)
Reason For Consult: Left iliac lesion Chief Complaint: Chief Complaint  Patient presents with  . Hip Pain    Referring Physician(s): Dr. Jana Hakim  History of Present Illness: William Baker is a 80 y.o. male who presented to ED with worsening left hip pain and inability to stand and imaging done revealed bladder tumor and left iliac lesion. Patient underwent TURBT on 10/30, pathology pending. IR received request for image guided left iliac lesion biopsy. The patient admits to overall weakness today and denies any chest pain, shortness of breath or palpitations. He denies any recent fever or chills. He has previously tolerated anesthesia without complications.    Past Medical History  Diagnosis Date  . Anemia   . Urinary retention   . CHF (congestive heart failure)   . Pulmonary hypertension   . Seizure   . Hypernatremia   . Aspiration into airway   . Lung injury   . Pneumonitis   . Fall     History reviewed. No pertinent past surgical history.  Allergies: Hydrochlorothiazide  Medications: Prior to Admission medications   Medication Sig Start Date End Date Taking? Authorizing Provider  acetaminophen (TYLENOL) 325 MG tablet Take 650 mg by mouth every 6 (six) hours as needed (for pain).   Yes Historical Provider, MD  atorvastatin (LIPITOR) 10 MG tablet Take 10 mg by mouth at bedtime.   Yes Historical Provider, MD  calcium carbonate (OS-CAL - DOSED IN MG OF ELEMENTAL CALCIUM) 1250 MG tablet Take 1 tablet by mouth daily with breakfast.   Yes Historical Provider, MD  cholecalciferol (VITAMIN D) 400 UNITS TABS tablet Take 800 Units by mouth daily with breakfast.   Yes Historical Provider, MD  donepezil (ARICEPT) 5 MG tablet Take 5 mg by mouth at bedtime.   Yes Historical Provider, MD  ENSURE (ENSURE) Take 237 mLs by mouth 3 (three) times daily. Patient drinks Vanilla flavor   Yes Historical Provider, MD  finasteride (PROSCAR) 5 MG tablet Take 5 mg by mouth daily with breakfast.    Yes Historical Provider, MD  Foot Care Products [facility] BUNION CUSHIONS) PADS Apply 1 applicator topically 3 (three) times a week.   Yes Historical Provider, MD  furosemide (LASIX) 40 MG tablet Take 40 mg by mouth daily with breakfast.   Yes Historical Provider, MD  hydrALAZINE (APRESOLINE) 25 MG tablet Take 25 mg by mouth 3 (three) times daily.   Yes Historical Provider, MD  levothyroxine (SYNTHROID, LEVOTHROID) 88 MCG tablet Take 88 mcg by mouth daily before breakfast.   Yes Historical Provider, MD  mineral oil liquid Place 5 mLs into the right ear once a week. On Tuesday's   Yes Historical Provider, MD  naproxen sodium (ANAPROX) 220 MG tablet Take 220 mg by mouth 2 (two) times daily with a meal.   Yes Historical Provider, MD  nystatin (MYCOSTATIN/NYSTOP) 100000 UNIT/GM POWD Apply 1 g topically daily.   Yes Historical Provider, MD  polyethylene glycol (MIRALAX / GLYCOLAX) packet Take 17 g by mouth daily.   Yes Historical Provider, MD  psyllium (REGULOID) 0.52 G capsule Take 0.52 g by mouth at bedtime.   Yes Historical Provider, MD  tamsulosin (FLOMAX) 0.4 MG CAPS capsule Take 0.4 mg by mouth daily with breakfast.   Yes Historical Provider, MD    History reviewed. No pertinent family history.  History   Social History  . Marital Status: Single    Spouse Name: N/A    Number of Children: N/A  . Years of Education: N/A  Social History Main Topics  . Smoking status: Never Smoker   . Smokeless tobacco: Never Used  . Alcohol Use: No  . Drug Use: No  . Sexual Activity: None   Other Topics Concern  . None   Social History Narrative  . None    Review of Systems: A 12 point ROS discussed and pertinent positives are indicated in the HPI above.  All other systems are negative.  Review of Systems  Vital Signs: BP 139/90  Pulse 73  Temp(Src) 97.7 F (36.5 C) (Oral)  Resp 18  Ht 5\' 7"  (1.702 m)  Wt 134 lb 4.2 oz (60.9 kg)  BMI 21.02 kg/m2  SpO2 99%  Physical Exam General:  A&Ox3, NAD, sitting up in bed Heart: RRR without M/G/R Lungs: CTA bilaterally Abd: Soft, ND Foley with bloody urine.   Imaging: Ct Abdomen Pelvis W Wo Contrast  12/17/2013   CLINICAL DATA:  Evaluate bladder mass.  EXAM: CT ABDOMEN AND PELVIS WITHOUT AND WITH CONTRAST  TECHNIQUE: Multidetector CT imaging of the abdomen and pelvis was performed following the standard protocol before and following the bolus administration of intravenous contrast.  CONTRAST:  138mL OMNIPAQUE IOHEXOL 300 MG/ML  SOLN  COMPARISON:  05/07/2006  FINDINGS: Atelectasis or scarring at the right lung base. Coronary artery calcifications. Enlarged heart. Intrathoracic stomach.  No appreciable abnormality of the liver, biliary system, spleen. Fatty atrophy of the pancreas. No adrenal lesion. Unremarkable left kidney.  Right kidney is mildly atrophic. There is severe right hydroureteronephrosis to the level of the bladder.  Moderate stool burden. Redundant sigmoid colon. No overt colitis. Appendix within normal limits. Small bowel loops are normal caliber. Similar to prior, several small bowel loops are positioned lateral to the descending colon. No free intraperitoneal air or fluid. No lymphadenopathy.  Scattered atherosclerotic disease of the aorta and branch vessels without aneurysmal dilatation.  Circumferential bladder wall thickening. Irregular mass at the right posterior bladder wall inseparable from the UVJ measuring up to 3.4 x 2.4 cm on series 5, image 60. Normal size prostate gland.  Lytic lesions left iliac bone and T11 vertebral body. Diffuse osteopenia.  IMPRESSION: Severe right hydroureteronephrosis to the level of a posterolateral right bladder mass inseparable from the UVJ.  Lytic lesions left iliac bone and T11 vertebral body, in keeping with metastatic disease.   Electronically Signed   By: Carlos Levering M.D.   On: 12/17/2013 23:11   Dg Chest 2 View  12/18/2013   CLINICAL DATA:  Bladder mass. CHF history, history  of pulmonary hypertension.  EXAM: CHEST  2 VIEW  COMPARISON:  05/17/2006  FINDINGS: Heart size upper normal. Mild left lung base opacity. Elevated left hemidiaphragm. Intrathoracic stomach. Hyperinflation. Interstitial coarsening. No pneumothorax. No definite pleural effusion. Osteopenia. Lytic lesions are not excluded in this patient with known T11 and iliac bone metastases.  IMPRESSION: Intrathoracic stomach.  Mild left lung base opacity, favor scarring.  COPD.   Electronically Signed   By: Carlos Levering M.D.   On: 12/18/2013 01:49   Dg Lumbar Spine Complete  12/16/2013   CLINICAL DATA:  Left hip pain, low back pain.  No known injury.  EXAM: LUMBAR SPINE - COMPLETE 4+ VIEW  COMPARISON:  CT of the abdomen and pelvis 05/07/2006  FINDINGS: Diffuse osteopenia. Postoperative changes in the lumbar spine. Normal alignment. No fracture. Mild degenerative facet disease throughout the lumbar spine. Early degenerative spurring anteriorly.  Diffuse aortic calcifications without visible aneurysm.  IMPRESSION: No acute bony abnormality.   Electronically  Signed   By: Rolm Baptise M.D.   On: 12/16/2013 13:39   Dg Hip Complete Left  12/16/2013   CLINICAL DATA:  Left hip pain without injury.  EXAM: LEFT HIP - COMPLETE 2+ VIEW  COMPARISON:  None.  FINDINGS: There is no evidence of hip fracture or dislocation. There is no evidence of arthropathy or other focal bone abnormality.  IMPRESSION: Normal left hip.   Electronically Signed   By: Sabino Dick M.D.   On: 12/16/2013 13:41   Mr Hip Left W Wo Contrast  12/16/2013   CLINICAL DATA:  Left hip pain for 2 weeks increasing with weight-bearing. CT scan revealed a mass in the left iliac bone.  EXAM: MRI OF THE LEFT HIP WITHOUT AND WITH CONTRAST  TECHNIQUE: Multiplanar, multisequence MR imaging was performed both before and after administration of intravenous contrast.  CONTRAST:  69mL MULTIHANCE GADOBENATE DIMEGLUMINE 529 MG/ML IV SOLN  COMPARISON:  12/16/2013  FINDINGS:  Extensive abnormal marrow infiltration with edema and enhancement along with a lytic expansile lesion of the left iliac bone measuring 6.7 by 2.8 by 5.6 cm, with a small amount of extraosseous spread tracking deep to the posterior portion of the gluteus minimus and possibly deep to the iliacus muscle. No sciatic notch impingement or direct impingement on the sciatic nerve. No obturator impingement due to this process.  There is a right hydroureter extending down to the UVJ. In the vicinity of the right UVJ, there is a polypoid 2.6 cm enhancing mass sitting atop a sessile 6.1 by 3.4 by 1.8 cm bladder mass along the urothelium. I do not see a similar filling defect in the visualized portion of the right ureter itself.  There is abnormal edema tracking through the soft tissues below the right ischium to the skin surface. No proximal femoral lesion is observed.  IMPRESSION:  1. Expansile lytic left iliac bone lesion with early extraosseous spread posteriorly and anteriorly, extending to the superior acetabular margin. No hip effusion or synovial enhancement in the hip joint. Appearance compatible with a metastatic lesion given the presence of the obvious tumor in the right-sided the urinary bladder is with sessile and polypoid components favoring transitional cell carcinoma. Conceivably this could represent superimposed myeloma or fibrous dysplasia but a metastatic lesion is strongly favored. I do not see an other bony lesion in the pelvis or proximal femurs.   Electronically Signed   By: Sherryl Barters M.D.   On: 12/16/2013 20:32   Ct Hip Left Wo Contrast  12/16/2013   CLINICAL DATA:  LEFT leg pain. Pain extending from hip to the foot. Initial encounter. 8/10 hip pain.  EXAM: CT OF THE LEFT HIP WITHOUT CONTRAST  TECHNIQUE: Multidetector CT imaging of the left hip was performed according to the standard protocol. Multiplanar CT image reconstructions were also generated.  COMPARISON:  Radiographs earlier today.   FINDINGS: There is no hip fracture. Severe atherosclerosis. The LEFT obturator ring appears intact. The hip is mildly flexed. No hip effusion is evident.  Partial visualization of the anatomic pelvis shows an exophytic nodule in the bladder base, with some calcifications. Bladder tumor such as transitional cell carcinoma cannot be excluded. Followup urology consultation should be considered.  There is soft tissue attenuation in the marrow space of the LEFT supra-acetabular ilium, along with . Although this could represent active red marrow and superimposed osteopenia, on osteolytic lesion cannot be excluded. This area is only partially visible. If the patient is a candidate for MRI, that would  be the preferred modality for further evaluation with and without contrast. Notably, marrow attenuation in other osteopenic bones appears normal and fatty.  IMPRESSION: 1. No acute osseous abnormality.  Negative for hip fracture. 2. Abnormal LEFT supra-acetabular ilium marrow space, suspicious for an osteolytic lesion although osteopenia and superimposed active red marrow could mimic this appearance. Followup MRI with and without contrast recommended if there are no contraindications. 3. Calcified nodule in the bladder base is partially visible. Followup urology consultation recommended.   Electronically Signed   By: Dereck Ligas M.D.   On: 12/16/2013 14:55    Labs:  CBC:  Recent Labs  12/16/13 2223 12/17/13 0359 12/18/13 0433 12/19/13 0517  WBC 8.2 8.4 7.6 11.3*  HGB 10.2* 9.5* 9.5* 10.2*  HCT 30.2* 29.2* 27.8* 30.9*  PLT 258 240 258 251    COAGS: No results found for this basename: INR, APTT,  in the last 8760 hours  BMP:  Recent Labs  12/16/13 2223 12/17/13 0359 12/17/13 1907 12/18/13 0433 12/19/13 0517  NA 132* 133*  --  133* 131*  K 4.3 4.3  --  4.3 4.6  CL 95* 97  --  98 96  CO2 25 25  --  25 22  GLUCOSE 96 107*  --  90 149*  BUN 30* 31* 28* 23 23  CALCIUM 8.5 8.4  --  8.3* 8.7    CREATININE 1.02 1.07 0.96 0.97 0.83  GFRNONAA 64* 61* 73* 73* 77*  GFRAA 75* 70* 85* 84* 90*    LIVER FUNCTION TESTS:  Recent Labs  12/17/13 0359  BILITOT 0.4  AST 18  ALT 9  ALKPHOS 109  PROT 6.8  ALBUMIN 3.0*    TUMOR MARKERS:  Recent Labs  12/18/13 0433  CEA 1.3    Assessment and Plan: Left hip pain with image findings of left iliac lesion, request for image guided biopsy Bladder tumor s/p TURBT 10/30, await final pathology Risks and Benefits of left iliac lesion biopsy with moderate sedation were discussed with the patient. All of the patient's questions were answered, patient is agreeable to proceed. Consent signed and in chart. Will make NPO for Monday procedure.    Thank you for this interesting consult.  I greatly enjoyed meeting William Baker and look forward to participating in their care.   I spent a total of 40 minutes face to face in clinical consultation, greater than 50% of which was counseling/coordinating care   Signed: Hedy Jacob 12/19/2013, 11:50 AM

## 2013-12-19 NOTE — Progress Notes (Addendum)
Patient ID: William Baker, male   DOB: 10/02/1925, 80 y.o.   MRN: [id number]  [facility] PROGRESS NOTE  DONYELL DING BZJ:[id number] DOB: [birth year] DOA: 12/16/2013 PCP: No primary provider on file.  Brief narrative:  Pt is 80 yo male who presented to [facility] ED with main concern of several weeks duration of progressively worsening left hip pain, throbbing and sharp, 10/10 in severity, radiating to the left knee, unable to bear weight, has been mostly bed bound as a result. Pt denies similar events in the past, no traumas to the area, no falls. Per NH records, pt has been ambulating with walker at baseline. No reported fevers and chills, no chest pain or shortness of breath.   In emergency department, patient noted to be hemodynamically stable, vital signs stable hip x-ray with no acute bony abnormalities, left hip MRI notable for expansile lytic left iliac bone lesion worrisome for metastatic lesion given the presence of obvious tumor in the right sided urinary bladder. Urology consulted and tried hospitalist asked to admit for further evaluation.   Assessment and Plan:   Principal Problem:  Acute left hip pain  - Most likely secondary to lytic lesion, worrisome for metastatic lesion in the setting off what appears to be urinary bladder cancer  - radiation oncology consulted for consideration of palliative radiation  - Provide analgesia as needed for symptom management, per rec's anticipate 10 treatments (2 weeks) directed at this area  - may be necessary to do an iliac bone biopsy for definitive diagnosis, tentatively schedule that for Nov 2nd, 2015  Active Problems:  Metastatic urinary bladder cancer  - appreciate urology and oncology assistance  - POD# 1 s/p transurethral resection of a bladder tumor - urine is clear  - started Levsin for bladder spasms - ontinue Foley for now - wait final pathology Hypothyroidism  - Continue Synthroid  HYPERTENSION, BENIGN SYSTEMIC  - Reasonable  inpatient control  - Continue hydralazine  Hyponatremia  - Secondary to prerenal etiology - IVF and repeat BMP in AM  Acute blood loss anemia  - Drop in hemoglobin since admission but stable over the past 2 hours  - CBC in AM  Hyperlipidemia  - Continue statin  Moderate protein calorie malnutrition  - Likely secondary to metastatic bladder cancer  - Advance diet as patient unable to tolerate   DVT prophylaxis  SCD's  Code Status: Full  Family Communication: Pt at bedside  Disposition Plan: Home when medically stable   IV Access:   Peripheral IV Procedures and diagnostic studies:    12/18/2013 transurethral resection of a bladder tumor Dg Lumbar Spine Complete 12/16/2013 No acute bony abnormality.  Dg Hip Complete Left 12/16/2013 Normal left hip.  Mr Hip Left W Wo Contrast 12/16/2013  1. Expansile lytic left iliac bone lesion with early extraosseous spread posteriorly and anteriorly, extending to the superior acetabular margin. No hip effusion or synovial enhancement in the hip joint. Appearance compatible with a metastatic lesion given the presence of the obvious tumor in the right-sided the urinary bladder is with sessile and polypoid components favoring transitional cell carcinoma. Conceivably this could represent superimposed myeloma or fibrous dysplasia but a metastatic lesion is strongly favored. I do not see an other bony lesion in the pelvis or proximal femurs.  Ct Hip Left Wo Contrast 12/16/2013  1. No acute osseous abnormality. Negative for hip fracture.  2. Abnormal LEFT supra-acetabular ilium marrow space, suspicious for an osteolytic lesion although osteopenia and superimposed active red marrow  could mimic this appearance.  3. Calcified nodule in the bladder base is partially visible. Followup urology consultation recommended.  Medical Consultants:   Urology  Radiation oncology  Oncology  Other Consultants:   Physical therapy  Anti-Infectives:    None  Faye Ramsay, MD  [facility] Pager [phone]  If 7PM-7AM, please contact night-coverage [email or web address] Password [email or web address] 12/19/2013, 10:57 AM   LOS: 3 days   HPI/Subjective: No events overnight.   Objective: Filed Vitals:   12/18/13 1915 12/18/13 1930 12/18/13 2114 12/19/13 0601  BP:  173/71 120/57 139/90  Pulse:  75 76 73  Temp: 97.5 F (36.4 C) 97.3 F (36.3 C) 97.3 F (36.3 C) 97.7 F (36.5 C)  TempSrc:   Oral Oral  Resp:  15 18 18   Height:      Weight:      SpO2:  95% 98% 99%    Intake/Output Summary (Last 24 hours) at 12/19/13 1057 Last data filed at 12/19/13 0900  Gross per 24 hour  Intake   2260 ml  Output   1125 ml  Net   1135 ml    Exam:   General:  Pt is alert, follows commands appropriately, not in acute distress  Cardiovascular: Regular rate and rhythm, S1/S2, no murmurs, no rubs, no gallops  Respiratory: Clear to auscultation bilaterally, no wheezing, no crackles, no rhonchi  Abdomen: Soft, non tender, non distended, bowel sounds present, no guarding  Extremities: No edema, pulses DP and PT palpable bilaterally  Neuro: Grossly nonfocal  Data Reviewed: Basic Metabolic Panel:  Recent Labs Lab 12/16/13 1555 12/16/13 2223 12/17/13 0359 12/17/13 1907 12/18/13 0433 12/19/13 0517  NA 132* 132* 133*  --  133* 131*  K 4.4 4.3 4.3  --  4.3 4.6  CL 97 95* 97  --  98 96  CO2  --  25 25  --  25 22  GLUCOSE 100* 96 107*  --  90 149*  BUN 37* 30* 31* 28* 23 23  CREATININE 1.10 1.02 1.07 0.96 0.97 0.83  CALCIUM  --  8.5 8.4  --  8.3* 8.7   Liver Function Tests:  Recent Labs Lab 12/17/13 0359  AST 18  ALT 9  ALKPHOS 109  BILITOT 0.4  PROT 6.8  ALBUMIN 3.0*   CBC:  Recent Labs Lab 12/16/13 1555 12/16/13 2223 12/17/13 0359 12/18/13 0433 12/19/13 0517  WBC  --  8.2 8.4 7.6 11.3*  HGB 11.2* 10.2* 9.5* 9.5* 10.2*  HCT 33.0* 30.2* 29.2* 27.8* 30.9*  MCV  --  84.4 85.9 85.0 86.1  PLT  --  258 240 258 251   CBG:  Recent  Labs Lab 12/17/13 0746 12/18/13 0736 12/19/13 0726  GLUCAP 106* 93 129*    Recent Results (from the past 240 hour(s))  URINE CULTURE     Status: None   Collection Time    12/18/13 12:27 AM      Result Value Ref Range Status   Specimen Description URINE, CLEAN CATCH   Final   Special Requests NONE   Final   Culture  Setup Time     Final   Value: 12/18/2013 04:14     Performed at [place]     Final   Value: 75,000 COLONIES/ML     Performed at [facility]   Culture     Final   Value: Multiple bacterial morphotypes present, none predominant. Suggest appropriate recollection if clinically indicated.     Performed at  [facility]   Report Status 12/19/2013 FINAL   Final  SURGICAL PCR SCREEN     Status: None   Collection Time    12/18/13 10:51 AM      Result Value Ref Range Status   MRSA, PCR NEGATIVE  NEGATIVE Final   Staphylococcus aureus NEGATIVE  NEGATIVE Final   Comment:            The Xpert SA Assay (FDA     approved for NASAL specimens     in patients over 3 years of age),     is one component of     a comprehensive surveillance     program.  Test performance has     been validated by [employer] for patients greater     than or equal to 44 year old.     It is not intended     to diagnose infection nor to     guide or monitor treatment.     Scheduled Meds: . atorvastatin  10 mg Oral QHS  . calcium carbonate  1 tablet Oral Q breakfast  . cholecalciferol  800 Units Oral Q breakfast  . donepezil  5 mg Oral QHS  . feeding supplement (ENSURE COMPLETE)  237 mL Oral TID  . finasteride  5 mg Oral Q breakfast  . folic acid  1 mg Oral Daily  . hydrALAZINE  25 mg Oral TID  . levothyroxine  88 mcg Oral QAC breakfast  . [START ON 12/22/2013] mineral oil  5 mL Otic Weekly  . multivitamin with minerals  1 tablet Oral Daily  . naproxen  250 mg Oral BID WC  . nystatin  1 g Topical Daily  . polyethylene glycol  17 g Oral Daily   . psyllium  1 packet Oral QHS  . tamsulosin  0.4 mg Oral Q breakfast  . thiamine  100 mg Oral Daily   Continuous Infusions: . sodium chloride 50 mL/hr at 12/19/13 0054

## 2013-12-19 NOTE — Progress Notes (Signed)
Patient ID: William Baker, male   DOB: 01/29/1926, 80 y.o.   MRN: [id number] 1 Day Post-Op Subjective: The patient is doing well.  No nausea or vomiting. He reports having a sensation of having to urinate constantly despite his Foley catheter draining.  Objective: Vital signs in last 24 hours: Temp:  [97.3 F (36.3 C)-97.7 F (36.5 C)] 97.7 F (36.5 C) (10/31 0601) Pulse Rate:  [61-76] 73 (10/31 0601) Resp:  [7-18] 18 (10/31 0601) BP: (111-173)/(45-90) 139/90 mmHg (10/31 0601) SpO2:  [95 %-100 %] 99 % (10/31 0601)  Intake/Output from previous day: 10/30 0701 - 10/31 0700 In: 1900 [I.V.:1900] Out: 1425 [Urine:1425] Intake/Output this shift:    Physical Exam:  General: Alert and oriented.Marland Kitchen GI: Soft, Nondistended. Urine: Clear   Lab Results:  Recent Labs  12/17/13 0359 12/18/13 0433 12/19/13 0517  HGB 9.5* 9.5* 10.2*  HCT 29.2* 27.8* 30.9*    Assessment/Plan: POD# 1 s/p  transurethral resection of a bladder tumor. His urine is clear. His Foley catheter is draining and he is experiencing some irritative voiding symptoms. He is otherwise doing well with a normal creatinine of 0.83.  1. Levsin for bladder spasms. 2. Continue Foley for now. 3. Await final pathology.

## 2013-12-20 DIAGNOSIS — C678 Malignant neoplasm of overlapping sites of bladder: Secondary | ICD-10-CM

## 2013-12-20 DIAGNOSIS — D494 Neoplasm of unspecified behavior of bladder: Secondary | ICD-10-CM

## 2013-12-20 DIAGNOSIS — E440 Moderate protein-calorie malnutrition: Secondary | ICD-10-CM

## 2013-12-20 DIAGNOSIS — I10 Essential (primary) hypertension: Secondary | ICD-10-CM

## 2013-12-20 DIAGNOSIS — M25559 Pain in unspecified hip: Secondary | ICD-10-CM

## 2013-12-20 LAB — CBC
HCT: 28.1 % — ABNORMAL LOW (ref 39.0–52.0)
Hemoglobin: 9.4 g/dL — ABNORMAL LOW (ref 13.0–17.0)
MCH: 28.7 pg (ref 26.0–34.0)
MCHC: 33.5 g/dL (ref 30.0–36.0)
MCV: 85.9 fL (ref 78.0–100.0)
Platelets: 245 10*3/uL (ref 150–400)
RBC: 3.27 MIL/uL — ABNORMAL LOW (ref 4.22–5.81)
RDW: 12.6 % (ref 11.5–15.5)
WBC: 10.8 10*3/uL — ABNORMAL HIGH (ref 4.0–10.5)

## 2013-12-20 LAB — BASIC METABOLIC PANEL
Anion gap: 9 (ref 5–15)
BUN: 27 mg/dL — ABNORMAL HIGH (ref 6–23)
CALCIUM: 8.4 mg/dL (ref 8.4–10.5)
CO2: 26 mEq/L (ref 19–32)
Chloride: 100 mEq/L (ref 96–112)
Creatinine, Ser: 0.94 mg/dL (ref 0.50–1.35)
GFR, EST AFRICAN AMERICAN: 85 mL/min — AB (ref >90)
GFR, EST NON AFRICAN AMERICAN: 74 mL/min — AB (ref >90)
Glucose, Bld: 90 mg/dL (ref 70–99)
Potassium: 4.2 mEq/L (ref 3.7–5.3)
SODIUM: 135 meq/L — AB (ref 137–147)

## 2013-12-20 LAB — PROTIME-INR
INR: 1.04 (ref 0.00–1.49)
Prothrombin Time: 13.8 seconds (ref 11.6–15.2)

## 2013-12-20 LAB — URINE CULTURE
Colony Count: NO GROWTH
Culture: NO GROWTH

## 2013-12-20 NOTE — Progress Notes (Signed)
2 Days Post-Op Subjective: Patient reports about the same amount of sensation in his bladder to suggest possible bladder spasms although the nursing staff indicates that he is having significantly less leakage around the catheter indicating that in fact his bladder spasms are being better controlled. Otherwise he has no new complaints.  Objective: Vital signs in last 24 hours: Temp:  [97.9 F (36.6 C)-98.7 F (37.1 C)] 98.1 F (36.7 C) (11/01 0528) Pulse Rate:  [58-74] 58 (11/01 0528) Resp:  [18-20] 18 (11/01 0528) BP: (110-139)/(46-61) 139/61 mmHg (11/01 0528) SpO2:  [99 %-100 %] 100 % (11/01 0528)  Intake/Output from previous day: 10/31 0701 - 11/01 0700 In: 760 [P.O.:360; I.V.:400] Out: 1200 [Urine:1200] Intake/Output this shift:    Physical Exam:  General:alert, cooperative and no distress His catheter is draining and his urine is completely clear.  Lab Results:  Recent Labs  12/18/13 0433 12/19/13 0517 12/20/13 0400  HGB 9.5* 10.2* 9.4*  HCT 27.8* 30.9* 28.1*   BMET  Recent Labs  12/19/13 0517 12/20/13 0400  NA 131* 135*  K 4.6 4.2  CL 96 100  CO2 22 26  GLUCOSE 149* 90  BUN 23 27*  CREATININE 0.83 0.94  CALCIUM 8.7 8.4    Recent Labs  12/20/13 0400  INR 1.04   No results for input(s): LABURIN in the last 72 hours. Results for orders placed or performed during the hospital encounter of 12/16/13  Culture, Urine     Status: None   Collection Time: 12/18/13 12:27 AM  Result Value Ref Range Status   Specimen Description URINE, CLEAN CATCH  Final   Special Requests NONE  Final   Culture  Setup Time   Final    12/18/2013 04:14 Performed at [place]   Final    75,000 COLONIES/ML Performed at [facility]   Culture   Final    Multiple bacterial morphotypes present, none predominant. Suggest appropriate recollection if clinically indicated. Performed at [facility]   Report Status 12/19/2013 FINAL  Final   Surgical pcr screen     Status: None   Collection Time: 12/18/13 10:51 AM  Result Value Ref Range Status   MRSA, PCR NEGATIVE NEGATIVE Final   Staphylococcus aureus NEGATIVE NEGATIVE Final    Comment:        The Xpert SA Assay (FDA approved for NASAL specimens in patients over 74 years of age), is one component of a comprehensive surveillance program.  Test performance has been validated by [employer] for patients greater than or equal to 66 year old. It is not intended to diagnose infection nor to guide or monitor treatment.  Urine culture     Status: None   Collection Time: 12/18/13 12:24 PM  Result Value Ref Range Status   Specimen Description   Final    URINE, RANDOM TRANSURETHAL RESECTION OF BLADDER TUMOR URINE BLADDER [facility]   Special Requests NONE  Final   Culture  Setup Time   Final    12/19/2013 00:18 Performed at [place] Performed at [facility]   Final   Culture NO GROWTH Performed at [facility]   Final   Report Status 12/20/2013 FINAL  Final    Studies/Results: No results found.  Assessment/Plan: He is doing well after his transurethral resectional biopsy but is still having some pain in the area of the bladder that initially was felt to be bladder spasms  however does appear to be controlled objectively with anticholinergics however I think what he is experiencing his discomfort from the local progression of the bladder tumor.  Continue Foley  Await pathology   LOS: 4 days   Lacrisha Bielicki C 12/20/2013, 8:28 AM

## 2013-12-20 NOTE — Plan of Care (Signed)
Problem: Phase III Progression Outcomes Goal: Activity at appropriate level-compared to baseline (UP IN CHAIR FOR HEMODIALYSIS)  Outcome: Completed/Met Date Met:  12/20/13

## 2013-12-20 NOTE — Plan of Care (Signed)
Problem: Phase III Progression Outcomes Goal: Pain controlled on oral analgesia Outcome: Completed/Met Date Met:  12/20/13

## 2013-12-20 NOTE — Plan of Care (Signed)
Problem: Phase I Progression Outcomes Goal: Initial discharge plan identified Outcome: Completed/Met Date Met:  12/20/13

## 2013-12-20 NOTE — Progress Notes (Addendum)
Patient ID: William Baker, male   DOB: 04-May-1925, 80 y.o.   MRN: [id number]  [facility] PROGRESS NOTE  MOHD. DERFLINGER BHA:[id number] DOB: 02/16/1926 DOA: 12/16/2013 PCP: No primary care provider on file.   Brief narrative:  Pt is 80 yo male who presented to [facility] ED with main concern of several weeks duration of progressively worsening left hip pain, throbbing and sharp, 10/10 in severity, radiating to the left knee, unable to bear weight, has been mostly bed bound as a result. Pt denies similar events in the past, no traumas to the area, no falls. Per NH records, pt has been ambulating with walker at baseline. No reported fevers and chills, no chest pain or shortness of breath.   In emergency department, patient noted to be hemodynamically stable, vital signs stable hip x-ray with no acute bony abnormalities, left hip MRI notable for expansile lytic left iliac bone lesion worrisome for metastatic lesion given the presence of obvious tumor in the right sided urinary bladder. Urology consulted and tried hospitalist asked to admit for further evaluation.   Assessment and Plan:   Principal Problem:  Acute left hip pain  - Most likely secondary to lytic lesion, worrisome for metastatic lesion in the setting off what appears to be urinary bladder cancer  - radiation oncology consulted for consideration of palliative radiation  - Provide analgesia as needed for symptom management, per rec's anticipate 10 treatments (2 weeks) directed at this area  - plan for iliac bone biopsy for definitive diagnosis, tentatively schedule that for Nov 2nd, 2015, appreciate IR assistance  Active Problems:  Metastatic urinary bladder cancer  - appreciate urology and oncology assistance  - POD# 2 s/p transurethral resection of a bladder tumor - urine is clear  - started Levsin for bladder spasms - ontinue Foley for now - wait final pathology Hypothyroidism  - Continue Synthroid  HYPERTENSION,  BENIGN SYSTEMIC  - Reasonable inpatient control  - Continue hydralazine  Hyponatremia  - Secondary to prerenal etiology, sodium improving  - IVF and repeat BMP in AM  Acute blood loss anemia  - Drop in hemoglobin since admission but stable over the past 48 hours  - CBC in AM  Hyperlipidemia  - Continue statin  Moderate protein calorie malnutrition  - Likely secondary to metastatic bladder cancer  - Advance diet as patient unable to tolerate   DVT prophylaxis   SCD's  Code Status: Full  Family Communication: Pt at bedside  Disposition Plan: Home when medically stable   IV Access:    Peripheral IV Procedures and diagnostic studies:    12/18/2013 transurethral resection of a bladder tumor  Dg Lumbar Spine Complete 12/16/2013 No acute bony abnormality.   Dg Hip Complete Left 12/16/2013 Normal left hip.   Mr Hip Left W Wo Contrast 12/16/2013 1. Expansile lytic left iliac bone lesion with early extraosseous spread posteriorly and anteriorly, extending to the superior acetabular margin. No hip effusion or synovial enhancement in the hip joint. Appearance compatible with a metastatic lesion given the presence of the obvious tumor in the right-sided the urinary bladder is with sessile and polypoid components favoring transitional cell carcinoma. Conceivably this could represent superimposed myeloma or fibrous dysplasia but a metastatic lesion is strongly favored. I do not see an other bony lesion in the pelvis or proximal femurs.   Ct Hip Left Wo Contrast 12/16/2013 1. No acute osseous abnormality. Negative for hip fracture.  2. Abnormal LEFT supra-acetabular ilium marrow space, suspicious for an osteolytic  lesion although osteopenia and superimposed active red marrow could mimic this appearance.  3. Calcified nodule in the bladder base is partially visible. Followup urology consultation recommended.  Medical Consultants:    Urology   Radiation oncology    Oncology  Other Consultants:    Physical therapy  Anti-Infectives:    None  Faye Ramsay, MD  [facility] Pager [phone]  If 7PM-7AM, please contact night-coverage [email or web address] Password [email or web address] 12/20/2013, 2:07 PM   LOS: 4 days   HPI/Subjective: No events overnight.   Objective: Filed Vitals:   12/19/13 1603 12/19/13 2026 12/20/13 0528 12/20/13 1402  BP: 110/46 128/53 139/61 116/54  Pulse:  63 58 81  Temp:  98.7 F (37.1 C) 98.1 F (36.7 C) 98.4 F (36.9 C)  TempSrc:  Oral Oral Oral  Resp:  18 18 18   Height:      Weight:      SpO2:  99% 100% 99%    Intake/Output Summary (Last 24 hours) at 12/20/13 1407 Last data filed at 12/20/13 1333  Gross per 24 hour  Intake    725 ml  Output   1000 ml  Net   -275 ml    Exam:   General:  Pt is alert, follows commands appropriately, not in acute distress  Cardiovascular: Regular rate and rhythm, no rubs, no gallops  Respiratory: Clear to auscultation bilaterally, no wheezing, no crackles, no rhonchi  Abdomen: Soft, non tender, non distended, bowel sounds present, no guarding  Extremities: No edema, pulses DP and PT palpable bilaterally  Data Reviewed: Basic Metabolic Panel:  Recent Labs Lab 12/16/13 2223 12/17/13 0359 12/17/13 1907 12/18/13 0433 12/19/13 0517 12/20/13 0400  NA 132* 133*  --  133* 131* 135*  K 4.3 4.3  --  4.3 4.6 4.2  CL 95* 97  --  98 96 100  CO2 25 25  --  25 22 26   GLUCOSE 96 107*  --  90 149* 90  BUN 30* 31* 28* 23 23 27*  CREATININE 1.02 1.07 0.96 0.97 0.83 0.94  CALCIUM 8.5 8.4  --  8.3* 8.7 8.4   Liver Function Tests:  Recent Labs Lab 12/17/13 0359  AST 18  ALT 9  ALKPHOS 109  BILITOT 0.4  PROT 6.8  ALBUMIN 3.0*   CBC:  Recent Labs Lab 12/16/13 2223 12/17/13 0359 12/18/13 0433 12/19/13 0517 12/20/13 0400  WBC 8.2 8.4 7.6 11.3* 10.8*  HGB 10.2* 9.5* 9.5* 10.2* 9.4*  HCT 30.2* 29.2* 27.8* 30.9* 28.1*  MCV 84.4 85.9 85.0 86.1 85.9  PLT 258 240 258 251  245   CBG:  Recent Labs Lab 12/17/13 0746 12/18/13 0736 12/19/13 0726  GLUCAP 106* 93 129*    Recent Results (from the past 240 hour(s))  Culture, Urine     Status: None   Collection Time: 12/18/13 12:27 AM  Result Value Ref Range Status   Specimen Description URINE, CLEAN CATCH  Final   Special Requests NONE  Final   Culture  Setup Time   Final    12/18/2013 04:14 Performed at [place]   Final    75,000 COLONIES/ML Performed at [facility]   Culture   Final    Multiple bacterial morphotypes present, none predominant. Suggest appropriate recollection if clinically indicated. Performed at [facility]   Report Status 12/19/2013 FINAL  Final  Surgical pcr screen     Status: None   Collection Time: 12/18/13 10:51 AM  Result Value Ref Range  Status   MRSA, PCR NEGATIVE NEGATIVE Final   Staphylococcus aureus NEGATIVE NEGATIVE Final    Comment:        The Xpert SA Assay (FDA approved for NASAL specimens in patients over 14 years of age), is one component of a comprehensive surveillance program.  Test performance has been validated by [employer] for patients greater than or equal to 30 year old. It is not intended to diagnose infection nor to guide or monitor treatment.  Urine culture     Status: None   Collection Time: 12/18/13 12:24 PM  Result Value Ref Range Status   Specimen Description   Final    URINE, RANDOM TRANSURETHAL RESECTION OF BLADDER TUMOR URINE BLADDER [facility]   Special Requests NONE  Final   Culture  Setup Time   Final    12/19/2013 00:18 Performed at [place] Performed at [facility]   Final   Culture NO GROWTH Performed at [facility]   Final   Report Status 12/20/2013 FINAL  Final     Scheduled Meds: . atorvastatin  10 mg Oral QHS  . calcium carbonate  1 tablet Oral Q breakfast  . cholecalciferol  800 Units Oral Q breakfast  . donepezil  5  mg Oral QHS  . feeding supplement (ENSURE COMPLETE)  237 mL Oral TID  . finasteride  5 mg Oral Q breakfast  . folic acid  1 mg Oral Daily  . hydrALAZINE  25 mg Oral TID  . levothyroxine  88 mcg Oral QAC breakfast  . [START ON 12/22/2013] mineral oil  5 mL Otic Weekly  . multivitamin with minerals  1 tablet Oral Daily  . naproxen  250 mg Oral BID WC  . nystatin  1 g Topical Daily  . polyethylene glycol  17 g Oral Daily  . psyllium  1 packet Oral QHS  . tamsulosin  0.4 mg Oral Q breakfast  . thiamine  100 mg Oral Daily   Continuous Infusions: . sodium chloride 50 mL/hr at 12/20/13 1333

## 2013-12-20 NOTE — Plan of Care (Signed)
Problem: Phase III Progression Outcomes Goal: Discharge plan remains appropriate-arrangements made Outcome: Completed/Met Date Met:  12/20/13

## 2013-12-21 ENCOUNTER — Encounter (HOSPITAL_COMMUNITY): Payer: Self-pay | Admitting: Urology

## 2013-12-21 ENCOUNTER — Telehealth: Payer: Self-pay | Admitting: Oncology

## 2013-12-21 ENCOUNTER — Inpatient Hospital Stay (HOSPITAL_COMMUNITY): Payer: Medicare HMO

## 2013-12-21 ENCOUNTER — Ambulatory Visit
Admit: 2013-12-21 | Discharge: 2013-12-21 | Disposition: A | Discharge status: Home / Self Care | Payer: Commercial Managed Care - HMO | Attending: Radiation Oncology | Admitting: Radiation Oncology

## 2013-12-21 ENCOUNTER — Other Ambulatory Visit: Payer: Self-pay | Admitting: Radiation Oncology

## 2013-12-21 DIAGNOSIS — C678 Malignant neoplasm of overlapping sites of bladder: Secondary | ICD-10-CM | POA: Insufficient documentation

## 2013-12-21 DIAGNOSIS — C7989 Secondary malignant neoplasm of other specified sites: Secondary | ICD-10-CM | POA: Insufficient documentation

## 2013-12-21 DIAGNOSIS — E038 Other specified hypothyroidism: Secondary | ICD-10-CM

## 2013-12-21 DIAGNOSIS — Z510 Encounter for antineoplastic radiation therapy: Secondary | ICD-10-CM | POA: Diagnosis not present

## 2013-12-21 LAB — CBC
HCT: 27.5 % — ABNORMAL LOW (ref 39.0–52.0)
Hemoglobin: 8.9 g/dL — ABNORMAL LOW (ref 13.0–17.0)
MCH: 28.2 pg (ref 26.0–34.0)
MCHC: 32.4 g/dL (ref 30.0–36.0)
MCV: 87 fL (ref 78.0–100.0)
Platelets: 229 10*3/uL (ref 150–400)
RBC: 3.16 MIL/uL — AB (ref 4.22–5.81)
RDW: 12.5 % (ref 11.5–15.5)
WBC: 9.3 10*3/uL (ref 4.0–10.5)

## 2013-12-21 LAB — PROTEIN ELECTROPHORESIS, SERUM
ALPHA-1-GLOBULIN: 4.8 % (ref 2.9–4.9)
Albumin ELP: 45.4 % — ABNORMAL LOW (ref 55.8–66.1)
Alpha-2-Globulin: 15.2 % — ABNORMAL HIGH (ref 7.1–11.8)
BETA 2: 6.2 % (ref 3.2–6.5)
Beta Globulin: 5.4 % (ref 4.7–7.2)
GAMMA GLOBULIN: 23 % — AB (ref 11.1–18.8)
M-Spike, %: NOT DETECTED g/dL
Total Protein ELP: 5.9 g/dL — ABNORMAL LOW (ref 6.0–8.3)

## 2013-12-21 LAB — BASIC METABOLIC PANEL
ANION GAP: 9 (ref 5–15)
BUN: 30 mg/dL — ABNORMAL HIGH (ref 6–23)
CO2: 27 meq/L (ref 19–32)
Calcium: 8.3 mg/dL — ABNORMAL LOW (ref 8.4–10.5)
Chloride: 96 mEq/L (ref 96–112)
Creatinine, Ser: 0.98 mg/dL (ref 0.50–1.35)
GFR calc Af Amer: 84 mL/min — ABNORMAL LOW (ref >90)
GFR calc non Af Amer: 72 mL/min — ABNORMAL LOW (ref >90)
Glucose, Bld: 77 mg/dL (ref 70–99)
Potassium: 4.5 mEq/L (ref 3.7–5.3)
SODIUM: 132 meq/L — AB (ref 137–147)

## 2013-12-21 LAB — KAPPA/LAMBDA LIGHT CHAINS
Kappa free light chain: 3.36 mg/dL — ABNORMAL HIGH (ref 0.33–1.94)
Kappa, lambda light chain ratio: 1.34 (ref 0.26–1.65)
Lambda free light chains: 2.51 mg/dL (ref 0.57–2.63)

## 2013-12-21 LAB — GLUCOSE, CAPILLARY
GLUCOSE-CAPILLARY: 102 mg/dL — AB (ref 70–99)
Glucose-Capillary: 92 mg/dL (ref 70–99)

## 2013-12-21 MED ORDER — FENTANYL CITRATE 0.05 MG/ML IJ SOLN
INTRAMUSCULAR | Status: AC | PRN
  Administered 2013-12-21: 25 ug via INTRAVENOUS

## 2013-12-21 MED ORDER — MIDAZOLAM HCL 2 MG/2ML IJ SOLN
INTRAMUSCULAR | Status: AC | PRN
  Administered 2013-12-21: 0.5 mg via INTRAVENOUS

## 2013-12-21 MED ORDER — MIDAZOLAM HCL 2 MG/2ML IJ SOLN
INTRAMUSCULAR | Status: AC
  Filled 2013-12-21: qty 4

## 2013-12-21 MED ORDER — FENTANYL CITRATE 0.05 MG/ML IJ SOLN
INTRAMUSCULAR | Status: AC
  Filled 2013-12-21: qty 4

## 2013-12-21 NOTE — Care Management Note (Addendum)
    Page 1 of 2   12/24/2013     11:56:50 AM CARE MANAGEMENT NOTE 12/24/2013  Patient:  William Baker, William Baker   Account Number:  [id number]  Date Initiated:  12/18/2013  Documentation initiated by:  Karl Bales  Subjective/Objective Assessment:   pt admitted with cco hip pain, Metastatic urinary bladder cancer     Action/Plan:   FROM [place] [facility].   Anticipated DC Date:  12/24/2013   Anticipated DC Plan:  [place]  CM consult      Choice offered to / List presented to:  C-1 Patient           Status of service:  In process, will continue to follow Medicare Important Message given?  YES (If response is "NO", the following Medicare IM given date fields will be blank) Date Medicare IM given:  12/21/2013 Medicare IM given by:  [facility] Date Additional Medicare IM given:  12/24/2013 Additional Medicare IM given by:  [facility]  Discharge Disposition:    Per UR Regulation:  Reviewed for med. necessity/level of care/duration of stay  If discussed at [place] of Stay Meetings, dates discussed:   12/22/2013  12/24/2013    Comments:  12/24/13 Rishon Thilges RN,BSN NCM [phone] AWAIT PATH FROM L ILIAC LESION FOR CHEMO TREATMENT.WOULD RECOMMEND PALLIATIVE CONVERSATION FOR GOC.XRT ONC FOLLOWING-XRT.URO-SIGNED OFF.D/C PLAN SNF-[facility] PER CSW.  12/22/13 Freda Jaquith RN,BSN NCM [phone] SPOKE TO [place] [facility]-TEL#[phone],[facility]-CONFIRMED PATIENT PCP-DR. HENRY TRIPP.AWAIT PT RECOMMENDATIONS.AWAIT BX RESULTS.XRT ONC FOLLOWING FOR TREATMENT PLAN.  12/21/13 Nephi Savage RN,BSN NCM [phone] BX TODAY-L ILIAC LESION.POD#2 TURBT.AWAIT PT RECOMMENDATIONS.  12/16/13 MMcGibboney, RN, BSN Chart reviewed.

## 2013-12-21 NOTE — Telephone Encounter (Signed)
Left a message with [facility] [address] that [facility] has a [place] appointment today at 11:00 and will be brought down by Nicki Reaper, Nurse Transporter.  The person answering the phone said she would give the message to Mr [facility] nurse as she is currently in the middle of an admission.

## 2013-12-21 NOTE — Procedures (Signed)
L iliac bone Bx 18 g core No comp

## 2013-12-21 NOTE — Plan of Care (Signed)
Problem: Phase II Progression Outcomes Goal: IV changed to normal saline lock Outcome: Completed/Met Date Met:  12/21/13

## 2013-12-21 NOTE — Progress Notes (Signed)
Patient ID: William Baker, male   DOB: Aug 14, 1925, 80 y.o.   MRN: [id number]  POD#3 TURBT - pt doing well.   Foley in place, urine clear.   I discussed pathology with Dr. Nicoletta Dress and TURBT pathology is consistent with muscle invasive high-grade urothelial carcinoma. We discussed the role of cystectomy, chemotherapy and radiation in the treatment of bladder cancer. Given his age and likely metastatic disease, he is not a cystectomy candidate. I'll defer to oncology and radiation oncology on palliative treatment once bone biopsy results are known. I discussed with patient and his stage, grade and prognosis.   Will DC Foley once plans are finalized.  Better for patient to void in diaper with frequent changes than keep an indwelling Foley.

## 2013-12-21 NOTE — Progress Notes (Addendum)
Patient ID: William Baker, male   DOB: [birth year], 80 y.o.   MRN: [id number]  [facility] PROGRESS NOTE  William Baker ALP:[id number] DOB: 01-13-1926 DOA: 12/16/2013 PCP: No primary care provider on file.   Brief narrative:  Pt is 80 yo male who presented to [facility] ED with main concern of several weeks duration of progressively worsening left hip pain, throbbing and sharp, 10/10 in severity, radiating to the left knee, unable to bear weight, has been mostly bed bound as a result. Pt denies similar events in the past, no traumas to the area, no falls. Per NH records, pt has been ambulating with walker at baseline. No reported fevers and chills, no chest pain or shortness of breath.   In emergency department, patient noted to be hemodynamically stable, vital signs stable hip x-ray with no acute bony abnormalities, left hip MRI notable for expansile lytic left iliac bone lesion worrisome for metastatic lesion given the presence of obvious tumor in the right sided urinary bladder. Urology consulted and hospitalist asked to admit for further evaluation.   Assessment and Plan:   Principal Problem:  Acute left hip pain  - Most likely secondary to lytic lesion, worrisome for metastatic lesion in the setting off what appears to be urinary bladder cancer  - radiation oncology consulted for consideration of palliative radiation  - Provide analgesia as needed for symptom management, per rec's anticipate 10 treatments (2 weeks) directed at this area  - plan for iliac bone biopsy today for definitive diagnosis, appreciate IR assistance  Active Problems:  Metastatic urinary bladder cancer  - appreciate urology and oncology assistance  - POD# 3 s/p transurethral resection of a bladder tumor - urine is clear  - started Levsin for bladder spasms - wait final pathology Hypothyroidism  - Continue Synthroid  HYPERTENSION, BENIGN SYSTEMIC  - Reasonable inpatient control  - Continue  hydralazine  Hyponatremia  - Secondary to prerenal etiology, sodium stable  Acute blood loss anemia, ? Secondary to bladder tumor  - Drop in hemoglobin since admission  - CBC in AM  Hyperlipidemia  - Continue statin  Moderate protein calorie malnutrition  - Likely secondary to metastatic bladder cancer  - Advance diet as patient unable to tolerate after biopsy Acute functional quadriplegia - secondary to acute illness outlined above - PT evaluation once to more medically stable   DVT prophylaxis   SCD's  Code Status: Full  Family Communication: Pt at bedside  Disposition Plan: Remains inpatient   IV Access:    Peripheral IV Procedures and diagnostic studies:    12/18/2013 transurethral resection of a bladder tumor  Dg Lumbar Spine Complete 12/16/2013 No acute bony abnormality.   Dg Hip Complete Left 12/16/2013 Normal left hip.   Mr Hip Left W Wo Contrast 12/16/2013 1. Expansile lytic left iliac bone lesion with early extraosseous spread posteriorly and anteriorly, extending to the superior acetabular margin. No hip effusion or synovial enhancement in the hip joint. Appearance compatible with a metastatic lesion given the presence of the obvious tumor in the right-sided the urinary bladder is with sessile and polypoid components favoring transitional cell carcinoma. Conceivably this could represent superimposed myeloma or fibrous dysplasia but a metastatic lesion is strongly favored. I do not see an other bony lesion in the pelvis or proximal femurs.   Ct Hip Left Wo Contrast 12/16/2013 1. No acute osseous abnormality. Negative for hip fracture.  2. Abnormal LEFT supra-acetabular ilium marrow space, suspicious for an osteolytic lesion although osteopenia and  superimposed active red marrow could mimic this appearance.  3. Calcified nodule in the bladder base is partially visible. Followup urology consultation recommended.  Medical Consultants:     Urology   Radiation oncology   Oncology  Other Consultants:    Physical therapy  Anti-Infectives:    None  Faye Ramsay, MD [facility] Pager [phone]  If 7PM-7AM, please contact night-coverage [email or web address] Password [email or web address] 12/21/2013, 7:12 AM   LOS: 5 days   HPI/Subjective: No events overnight.   Objective: Filed Vitals:   12/20/13 0528 12/20/13 1402 12/20/13 2200 12/21/13 0356  BP: 139/61 116/54 146/67 138/53  Pulse: 58 81 70 54  Temp: 98.1 F (36.7 C) 98.4 F (36.9 C) 97.5 F (36.4 C) 98.1 F (36.7 C)  TempSrc: Oral Oral Oral Oral  Resp: 18 18 18 18   Height:      Weight:      SpO2: 100% 99% 100% 95%    Intake/Output Summary (Last 24 hours) at 12/21/13 8299 Last data filed at 12/21/13 0356  Gross per 24 hour  Intake 831.17 ml  Output   1300 ml  Net -468.83 ml    Exam:   General:  Pt is alert, follows commands appropriately, not in acute distress  Cardiovascular: Regular rate and rhythm, no rubs, no gallops  Respiratory: Clear to auscultation bilaterally, no wheezing, no crackles, no rhonchi  Abdomen: Soft, non tender, non distended, bowel sounds present, no guarding  Data Reviewed: Basic Metabolic Panel:  Recent Labs Lab 12/17/13 0359 12/17/13 1907 12/18/13 0433 12/19/13 0517 12/20/13 0400 12/21/13 0353  NA 133*  --  133* 131* 135* 132*  K 4.3  --  4.3 4.6 4.2 4.5  CL 97  --  98 96 100 96  CO2 25  --  25 22 26 27   GLUCOSE 107*  --  90 149* 90 77  BUN 31* 28* 23 23 27* 30*  CREATININE 1.07 0.96 0.97 0.83 0.94 0.98  CALCIUM 8.4  --  8.3* 8.7 8.4 8.3*   Liver Function Tests:  Recent Labs Lab 12/17/13 0359  AST 18  ALT 9  ALKPHOS 109  BILITOT 0.4  PROT 6.8  ALBUMIN 3.0*   CBC:  Recent Labs Lab 12/17/13 0359 12/18/13 0433 12/19/13 0517 12/20/13 0400 12/21/13 0353  WBC 8.4 7.6 11.3* 10.8* 9.3  HGB 9.5* 9.5* 10.2* 9.4* 8.9*  HCT 29.2* 27.8* 30.9* 28.1* 27.5*  MCV 85.9 85.0 86.1 85.9 87.0  PLT 240  258 251 245 229   BNP: Invalid input(s): POCBNP CBG:  Recent Labs Lab 12/17/13 0746 12/18/13 0736 12/19/13 0726  GLUCAP 106* 93 129*    Recent Results (from the past 240 hour(s))  Culture, Urine     Status: None   Collection Time: 12/18/13 12:27 AM  Result Value Ref Range Status   Specimen Description URINE, CLEAN CATCH  Final   Special Requests NONE  Final   Culture  Setup Time   Final    12/18/2013 04:14 Performed at [place]   Final    75,000 COLONIES/ML Performed at [facility]   Culture   Final    Multiple bacterial morphotypes present, none predominant. Suggest appropriate recollection if clinically indicated. Performed at [facility]   Report Status 12/19/2013 FINAL  Final  Surgical pcr screen     Status: None   Collection Time: 12/18/13 10:51 AM  Result Value Ref Range Status   MRSA, PCR NEGATIVE NEGATIVE Final   Staphylococcus aureus NEGATIVE  NEGATIVE Final    Comment:        The Xpert SA Assay (FDA approved for NASAL specimens in patients over 44 years of age), is one component of a comprehensive surveillance program.  Test performance has been validated by [employer] for patients greater than or equal to 68 year old. It is not intended to diagnose infection nor to guide or monitor treatment.  Urine culture     Status: None   Collection Time: 12/18/13 12:24 PM  Result Value Ref Range Status   Specimen Description   Final    URINE, RANDOM TRANSURETHAL RESECTION OF BLADDER TUMOR URINE BLADDER [facility]   Special Requests NONE  Final   Culture  Setup Time   Final    12/19/2013 00:18 Performed at [place] Performed at [facility]   Final   Culture NO GROWTH Performed at [facility]   Final   Report Status 12/20/2013 FINAL  Final     Scheduled Meds: . atorvastatin  10 mg Oral QHS  . calcium carbonate  1 tablet Oral Q breakfast  . cholecalciferol   800 Units Oral Q breakfast  . donepezil  5 mg Oral QHS  . feeding supplement (ENSURE COMPLETE)  237 mL Oral TID  . finasteride  5 mg Oral Q breakfast  . folic acid  1 mg Oral Daily  . hydrALAZINE  25 mg Oral TID  . levothyroxine  88 mcg Oral QAC breakfast  . [START ON 12/22/2013] mineral oil  5 mL Otic Weekly  . multivitamin with minerals  1 tablet Oral Daily  . naproxen  250 mg Oral BID WC  . nystatin  1 g Topical Daily  . polyethylene glycol  17 g Oral Daily  . psyllium  1 packet Oral QHS  . tamsulosin  0.4 mg Oral Q breakfast  . thiamine  100 mg Oral Daily   Continuous Infusions: . sodium chloride 30 mL/hr at 12/20/13 1411

## 2013-12-22 DIAGNOSIS — C679 Malignant neoplasm of bladder, unspecified: Secondary | ICD-10-CM

## 2013-12-22 DIAGNOSIS — C7951 Secondary malignant neoplasm of bone: Principal | ICD-10-CM

## 2013-12-22 LAB — BASIC METABOLIC PANEL
Anion gap: 11 (ref 5–15)
BUN: 23 mg/dL (ref 6–23)
CALCIUM: 8.7 mg/dL (ref 8.4–10.5)
CO2: 26 mEq/L (ref 19–32)
CREATININE: 0.86 mg/dL (ref 0.50–1.35)
Chloride: 97 mEq/L (ref 96–112)
GFR, EST AFRICAN AMERICAN: 88 mL/min — AB (ref >90)
GFR, EST NON AFRICAN AMERICAN: 76 mL/min — AB (ref >90)
Glucose, Bld: 141 mg/dL — ABNORMAL HIGH (ref 70–99)
Potassium: 4 mEq/L (ref 3.7–5.3)
SODIUM: 134 meq/L — AB (ref 137–147)

## 2013-12-22 LAB — CBC
HCT: 28.8 % — ABNORMAL LOW (ref 39.0–52.0)
HCT: 30.8 % — ABNORMAL LOW (ref 39.0–52.0)
HEMOGLOBIN: 10.1 g/dL — AB (ref 13.0–17.0)
Hemoglobin: 9.4 g/dL — ABNORMAL LOW (ref 13.0–17.0)
MCH: 28.1 pg (ref 26.0–34.0)
MCH: 27.8 pg (ref 26.0–34.0)
MCHC: 32.6 g/dL (ref 30.0–36.0)
MCHC: 32.8 g/dL (ref 30.0–36.0)
MCV: 86.2 fL (ref 78.0–100.0)
MCV: 84.8 fL (ref 78.0–100.0)
PLATELETS: 237 10*3/uL (ref 150–400)
Platelets: 247 10*3/uL (ref 150–400)
RBC: 3.34 MIL/uL — AB (ref 4.22–5.81)
RBC: 3.63 MIL/uL — ABNORMAL LOW (ref 4.22–5.81)
RDW: 12.4 % (ref 11.5–15.5)
RDW: 12.2 % (ref 11.5–15.5)
WBC: 7.9 10*3/uL (ref 4.0–10.5)
WBC: 8.2 10*3/uL (ref 4.0–10.5)

## 2013-12-22 LAB — GLUCOSE, CAPILLARY: Glucose-Capillary: 89 mg/dL (ref 70–99)

## 2013-12-22 NOTE — Progress Notes (Signed)
Patient ID: William Baker, male   DOB: Jul 12, 1925, 80 y.o.   MRN: [id number]  HG T2 bladder cancer - bone biopsy pending.   Urine clear.   Will d/c foley in AM and have pt use bedside toilet and/or hand-held urinal.

## 2013-12-22 NOTE — Progress Notes (Signed)
Patient evaluated for [place] [facility] services. He reports his Primary Care MD is Dr Fredderick Phenix who is not a [facility] provider. Will follow up with patient to have changed in the [facility] system. Will follow up with patient at later time as MD came in while speaking with him. Spoke with inpatient RNCM about above. Marthenia Rolling, MSN- [facility] [phone]

## 2013-12-22 NOTE — Plan of Care (Signed)
Problem: Phase II Progression Outcomes Goal: Obtain order to discontinue catheter if appropriate Outcome: Completed/Met Date Met:  12/22/13 Goal: Other Phase II Outcomes/Goals Outcome: Completed/Met Date Met:  12/22/13

## 2013-12-22 NOTE — Evaluation (Signed)
Physical Therapy Evaluation Patient Details Name: William Baker MRN: [id number] DOB: Jul 13, 1925 Today's Date: 12/22/2013   History of Present Illness  Pt is an 80 year old male admitted for acute left hip pain in the setting of expansile lytic bone lesion probable metastatic disease from bladder carcinoma.  Pt as been mostly in bed since hip pain increased however typically ambulates with RW at baseline from ALF.  Clinical Impression  Pt currently with functional limitations due to the deficits listed below (see PT Problem List).  Pt will benefit from skilled PT to increase their independence and safety with mobility to allow discharge to the venue listed below.  Pt very fearful of increasing L hip pain however none reported with mobility today.  Pt required encouragement to mobilize due to fear and would benefit from SNF upon d/c to assist with returning to mobility baseline, strengthening, and improving endurance.     Follow Up Recommendations SNF    Equipment Recommendations  None recommended by PT    Recommendations for Other Services       Precautions / Restrictions Precautions Precautions: Fall      Mobility  Bed Mobility Overal bed mobility: Needs Assistance;+2 for physical assistance Bed Mobility: Supine to Sit     Supine to sit: +2 for physical assistance;Mod assist     General bed mobility comments: pt able to initiate movement, difficulty scooting to EOB so utilized bed pad  Transfers Overall transfer level: Needs assistance Equipment used: Rolling walker (2 wheeled) Transfers: Sit to/from Stand Sit to Stand: +2 physical assistance;Mod assist;From elevated surface         General transfer comment: verbal cues for safe technique, increased encouragement to try increasing mobility today, pt assisted to standing and reported no pain however fatigued quickly and requested return to sitting  Ambulation/Gait Ambulation/Gait assistance:  (pt felt unable)               Stairs            Wheelchair Mobility    Modified Rankin (Stroke Patients Only)       Balance                                             Pertinent Vitals/Pain Pain Assessment: No/denies pain (pt fearful of pain but reported no pain with sitting or WBing)    Home Living Family/patient expects to be discharged to:: Assisted living                      Prior Function Level of Independence: Independent with assistive device(s)         Comments: however mostly bed bound per chart review since increase in L hip pain     Hand Dominance        Extremity/Trunk Assessment   Upper Extremity Assessment: Generalized weakness (pt reports L UE weaker then R UE)           Lower Extremity Assessment: Generalized weakness;LLE deficits/detail   LLE Deficits / Details: lacking approx 10 degrees of extension, unable to perform full AROM knee extension 2+/5  Cervical / Trunk Assessment: Kyphotic;Other exceptions  Communication   Communication: HOH (very difficult to understand speech)  Cognition Arousal/Alertness: Awake/alert Behavior During Therapy: WFL for tasks assessed/performed Overall Cognitive Status: Within Functional Limits for tasks assessed  General Comments      Exercises        Assessment/Plan    PT Assessment Patient needs continued PT services  PT Diagnosis Difficulty walking;Generalized weakness   PT Problem List Decreased strength;Decreased mobility;Decreased activity tolerance;Decreased knowledge of use of DME  PT Treatment Interventions DME instruction;Gait training;Functional mobility training;Therapeutic activities;Therapeutic exercise;Patient/family education   PT Goals (Current goals can be found in the Care Plan section) Acute Rehab PT Goals PT Goal Formulation: With patient Time For Goal Achievement: 01/05/14 Potential to Achieve Goals: Good    Frequency Min  3X/week   Barriers to discharge        Co-evaluation               End of Session   Activity Tolerance: Patient limited by fatigue Patient left: in bed;with call bell/phone within reach;with bed alarm set Nurse Communication: Mobility status         Time: 7846-9629 PT Time Calculation (min): 19 min   Charges:   PT Evaluation $Initial PT Evaluation Tier I: 1 Procedure PT Treatments $Therapeutic Activity: 8-22 mins   PT G Codes:          [place] Cap[place],KATHrine E 12/22/2013, 1:02 PM Carmelia Bake, PT, DPT 12/22/2013 Pager: [phone]

## 2013-12-22 NOTE — Progress Notes (Signed)
  Radiation Oncology         [phone] ________________________________  Name: William Baker MRN: [id number]  Date: 12/21/2013  DOB: [birth year]  SIMULATION AND TREATMENT PLANNING NOTE - inpatient    ICD-9-CM ICD-10-CM   1. Malignant neoplasm of overlapping sites of bladder 188.8 C67.8     DIAGNOSIS:  Probable stage IV bladder cancer  NARRATIVE:  The patient was brought to the [place].  Identity was confirmed.  All relevant records and images related to the planned course of therapy were reviewed.  The patient freely provided informed written consent to proceed with treatment after reviewing the details related to the planned course of therapy. The consent form was witnessed and verified by the simulation staff.  Then, the patient was set-up in a stable reproducible  supine position for radiation therapy.  CT images were obtained.  Surface markings were placed.  The CT images were loaded into the planning software.  Then the target and avoidance structures were contoured.  Treatment planning then occurred.  The radiation prescription was entered and confirmed.  Then, I designed and supervised the construction of a total of 3 medically necessary complex treatment devices.  I have requested : Isodose Plan.  I have ordered:dose calc.  PLAN:  The patient will receive 30 Gy in 10 fractions.   ________________________________  -----------------------------------  Blair Promise, PhD, MD

## 2013-12-22 NOTE — Plan of Care (Signed)
Problem: Phase I Progression Outcomes Goal: Other Phase I Outcomes/Goals Outcome: Completed/Met Date Met:  12/22/13  Problem: Phase III Progression Outcomes Goal: Voiding independently Outcome: Completed/Met Date Met:  12/22/13 Goal: Foley discontinued Outcome: Completed/Met Date Met:  12/22/13 Goal: Other Phase III Outcomes/Goals Outcome: Completed/Met Date Met:  12/22/13

## 2013-12-22 NOTE — Progress Notes (Addendum)
Patient ID: William Baker, male   DOB: 1925/11/21, 80 y.o.   MRN: [id number] [facility] PROGRESS NOTE  William Baker OHY:[id number] DOB: 09-29-1925 DOA: 12/16/2013 PCP: [name], MD  Brief narrative: Pt is 80 yo male who presented to [facility] ED 12/16/2013 with main concern of several weeks duration of progressively worsening left hip pain, throbbing and sharp, 10/10 in severity, radiating to the left knee, unable to bear weight, has been mostly bed bound as a result. Pt denied similar events in the past, no traumas to the area, no falls. Per NH records, pt has been ambulating with walker at baseline. No reported fevers.  In emergency department, patient noted to be hemodynamically stable, vital signs stable hip x-ray with no acute bony abnormalities, left hip MRI notable for expansile lytic left iliac bone lesion worrisome for metastatic lesion given the presence of obvious tumor in the right sided urinary bladder.    Assessment and Plan:    Principal Problem:  Acute left hip pain in the setting of expansile lytic bone lesion probable metastatic disease from bladder carcinoma   - Pain likely secondary to lytic lesion, worrisome for metastatic lesion in the setting off what appears to be urinary bladder cancer  - TUR results show a high-grade bladder carcinoma. Biopsy of left iliac bone performed 11/2, results pending - SPEP and kappa/lambda studies do not suggest myeloma. CEA is normal.  - Radiation oncology consulted and radiation to the left pelvis lesion planned through NOV 17. - chemotherapy treatment plan is dependant on bone biopsy results.  - Provide analgesia as needed for symptom management Active Problems:  Metastatic urinary bladder cancer  - appreciate urology and oncology assistance  - s/p transurethral resection of a bladder tumor with results consistent with high grace bladder carcinoma, not candidate for cystectomy due to age and other comorbid conditions.  -  started Levsin for bladder spasms - oncology team to rrange for outpatient follow-up at the cancer center BPH - continue Flomax and Proscar  Hypothyroidism  - Continue Synthroid  HYPERTENSION, BENIGN SYSTEMIC  - Reasonable inpatient control  - Continue hydralazine  Hyponatremia  - Secondary to prerenal etiology, sodium stable at 134 this AM Acute blood loss anemia, Anemia of chronic disease secondary to bladder tumor  - Drop in hemoglobin since admission  - hemoglobin now 9.4 - no current indication for transfusion  - continue folic acid and multivitamin  Hyperlipidemia  - Continue statin  Moderate protein calorie malnutrition  - Likely secondary to metastatic bladder cancer  - Advance diet as patient able to tolerate after biopsy - nutritional supplementation  Acute functional quadriplegia - Secondary to acute illness outlined above - PT evaluation done, recommendation is to d/c pt to SDF once he is medically cleared   DVT prophylaxis   SCD's  Code Status: Full  Family Communication: Pt at bedside  Disposition Plan: Remains inpatient, will needs SNF upon discharge, SW consulted   IV Access:    Peripheral IV Procedures and diagnostic studies:    12/18/2013 transurethral resection of a bladder tumor  Dg Lumbar Spine Complete 12/16/2013 No acute bony abnormality.   Dg Hip Complete Left 12/16/2013 Normal left hip.   Mr Hip Left W Wo Contrast 12/16/2013 1. Expansile lytic left iliac bone lesion with early extraosseous spread posteriorly and anteriorly, extending to the superior acetabular margin. No hip effusion or synovial enhancement in the hip joint. Appearance compatible with a metastatic lesion given the presence of the obvious tumor in  the right-sided the urinary bladder is with sessile and polypoid components favoring transitional cell carcinoma. Conceivably this could represent superimposed myeloma or fibrous dysplasia but a metastatic lesion  is strongly favored. I do not see an other bony lesion in the pelvis or proximal femurs.   Ct Hip Left Wo Contrast 12/16/2013 1. No acute osseous abnormality. Negative for hip fracture.  2. Abnormal LEFT supra-acetabular ilium marrow space, suspicious for an osteolytic lesion although osteopenia and superimposed active red marrow could mimic this appearance.  3. Calcified nodule in the bladder base is partially visible. Followup urology consultation recommended.  Medical Consultants:   Urology (Dr. Festus Aloe) Oncology (Dr. Lurline Del) Radiation oncology (Dr. Gery Pray)  Other Consultants:   Physical therapy  Anti-Infectives:   None   Faye Ramsay, MD  [facility] Pager [phone]  If 7PM-7AM, please contact night-coverage [email or web address] Password [facility] 12/22/2013, 11:33 AM   LOS: 6 days   HPI/Subjective: No events overnight.   Objective: Filed Vitals:   12/21/13 1513 12/21/13 2124 12/22/13 0526 12/22/13 1031  BP: 123/68 131/55 131/59 115/49  Pulse: 64 63 65 70  Temp: 98.2 F (36.8 C) 98.3 F (36.8 C) 98.1 F (36.7 C)   TempSrc: Oral Oral Oral   Resp: 18  18   Height:      Weight:      SpO2: 98% 98% 100% 96%    Intake/Output Summary (Last 24 hours) at 12/22/13 1133 Last data filed at 12/22/13 6301  Gross per 24 hour  Intake    480 ml  Output   2225 ml  Net  -1745 ml    Exam:   General:  Pt is alert,  not in acute distress  Cardiovascular: Regular rate and rhythm, S1/S2 appreciated   Respiratory: no wheezing, no crackles, no rhonchi  Abdomen: Snon distended, bowel sounds present, no guarding  Extremities: pulses DP and PT palpable bilaterally  Neuro: Grossly nonfocal  Data Reviewed: Basic Metabolic Panel:  Recent Labs Lab 12/18/13 0433 12/19/13 0517 12/20/13 0400 12/21/13 0353 12/22/13 0433  NA 133* 131* 135* 132* 134*  K 4.3 4.6 4.2 4.5 4.0  CL 98 96 100 96 97  CO2 25 22 26 27 26   GLUCOSE 90 149* 90 77 141*  BUN 23 23 27*  30* 23  CREATININE 0.97 0.83 0.94 0.98 0.86  CALCIUM 8.3* 8.7 8.4 8.3* 8.7   Liver Function Tests:  Recent Labs Lab 12/17/13 0359  AST 18  ALT 9  ALKPHOS 109  BILITOT 0.4  PROT 6.8  ALBUMIN 3.0*   CBC:  Recent Labs Lab 12/18/13 0433 12/19/13 0517 12/20/13 0400 12/21/13 0353 12/22/13 0433  WBC 7.6 11.3* 10.8* 9.3 7.9  HGB 9.5* 10.2* 9.4* 8.9* 9.4*  HCT 27.8* 30.9* 28.1* 27.5* 28.8*  MCV 85.0 86.1 85.9 87.0 86.2  PLT 258 251 245 229 237   CBG:  Recent Labs Lab 12/18/13 0736 12/19/13 0726 12/20/13 0837 12/21/13 0723 12/22/13 0742  GLUCAP 93 129* 92 102* 89    Culture, Urine     Status: None   Collection Time: 12/18/13 12:27 AM  Result Value Ref Range Status   Specimen Description URINE, CLEAN CATCH  Final    Multiple bacterial morphotypes present, none predominant. Suggest appropriate recollection if clinically indicated. Performed at [facility]   Report Status 12/19/2013 FINAL  Final  Surgical pcr screen     Status: None   Collection Time: 12/18/13 10:51 AM  Result Value Ref Range Status   MRSA, PCR  NEGATIVE NEGATIVE Final   Staphylococcus aureus NEGATIVE NEGATIVE Final    Comment:       Urine culture     Status: None   Collection Time: 12/18/13 12:24 PM  Result Value Ref Range Status   Specimen Description   Final    URINE, RANDOM TRANSURETHAL RESECTION OF BLADDER TUMOR URINE BLADDER [place]   Culture NO GROWTH  Final   Report Status 12/20/2013 FINAL  Final     Scheduled Meds: . atorvastatin  10 mg Oral QHS  . calcium carbonate  1 tablet Oral Q breakfast  . cholecalciferol  800 Units Oral Q breakfast  . donepezil  5 mg Oral QHS  . feeding supplement (ENSURE COMPLETE)  237 mL Oral TID  . finasteride  5 mg Oral Q breakfast  . folic acid  1 mg Oral Daily  . hydrALAZINE  25 mg Oral TID  . levothyroxine  88 mcg Oral QAC breakfast  . mineral oil  5 mL Otic Weekly  . multivitamin   1 tablet Oral Daily  . naproxen  250 mg Oral BID WC  .  nystatin  1 g Topical Daily  . polyethylene glycol  17 g Oral Daily  . psyllium  1 packet Oral QHS  . tamsulosin  0.4 mg Oral Q breakfast  . thiamine  100 mg Oral Daily

## 2013-12-22 NOTE — Progress Notes (Signed)
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 Biopsy of left iliac bone performed 11/2, results pending, should be available next 24-48 hours. SPEP and kappa/lambda studies do not suggest myeloma. CEA is normal. Radiation to the left pelvis lesion planned through NOV 17. If patient is to be discharged I will arrange for outpatient follow-up at the cancer center. From med onc point of view overall treatment plan depends on results of hip biopsy.  Will follow with you.

## 2013-12-23 ENCOUNTER — Ambulatory Visit
Admit: 2013-12-23 | Discharge: 2013-12-23 | Disposition: A | Discharge status: Home / Self Care | Payer: Commercial Managed Care - HMO | Attending: Radiation Oncology | Admitting: Radiation Oncology

## 2013-12-23 DIAGNOSIS — E039 Hypothyroidism, unspecified: Secondary | ICD-10-CM

## 2013-12-23 DIAGNOSIS — C679 Malignant neoplasm of bladder, unspecified: Secondary | ICD-10-CM

## 2013-12-23 LAB — BASIC METABOLIC PANEL
Anion gap: 10 (ref 5–15)
BUN: 21 mg/dL (ref 6–23)
CALCIUM: 8.7 mg/dL (ref 8.4–10.5)
CO2: 27 meq/L (ref 19–32)
CREATININE: 0.86 mg/dL (ref 0.50–1.35)
Chloride: 94 mEq/L — ABNORMAL LOW (ref 96–112)
GFR calc Af Amer: 88 mL/min — ABNORMAL LOW (ref >90)
GFR calc non Af Amer: 76 mL/min — ABNORMAL LOW (ref >90)
GLUCOSE: 92 mg/dL (ref 70–99)
Potassium: 4.2 mEq/L (ref 3.7–5.3)
SODIUM: 131 meq/L — AB (ref 137–147)

## 2013-12-23 LAB — GLUCOSE, CAPILLARY: Glucose-Capillary: 98 mg/dL (ref 70–99)

## 2013-12-23 MED ORDER — SODIUM CHLORIDE 0.9 % IV SOLN: INTRAVENOUS | Status: DC

## 2013-12-23 MED ORDER — SODIUM CHLORIDE 0.9 % IV SOLN
INTRAVENOUS | Status: AC
  Administered 2013-12-23: 20:00:00 via INTRAVENOUS

## 2013-12-23 NOTE — Progress Notes (Signed)
Came back to speak with patient on behalf of [facility] [employer] [facility] again. However, he is on the way off the unit. Will come back at later time. Marthenia Rolling, MSN- [facility] [phone]

## 2013-12-23 NOTE — Progress Notes (Signed)
CSW reviewed PT evaluation recommending SNF & spoke with patient's brother, [name] via phone who is agreeable with plan for [facility] at discharge.   Clinical Social Work Department CLINICAL SOCIAL WORK PLACEMENT NOTE 12/23/2013  Patient:  William Baker, William Baker  Account Number:  [id number] Admit date:  12/16/2013  Clinical Social Worker:  Renold Genta  Date/time:  12/23/2013 10:56 AM  Clinical Social Work is seeking post-discharge placement for this patient at the following level of care:   SKILLED NURSING   (*CSW will update this form in Epic as items are completed)   12/23/2013  Patient/family provided with [place] Department of Clinical Social Work's list of facilities offering this level of care within the geographic area requested by the patient (or if unable, by the patient's family).  12/23/2013  Patient/family informed of their freedom to choose among providers that offer the needed level of care, that participate in Medicare, Medicaid or managed care program needed by the patient, have an available bed and are willing to accept the patient.  12/23/2013  Patient/family informed of [facility]' ownership interest in [facility], as well as of the fact that they are under no obligation to receive care at this facility.  PASARR submitted to EDS on 12/23/2013 PASARR number received on 12/23/2013  FL2 transmitted to all facilities in geographic area requested by pt/family on  12/23/2013 FL2 transmitted to all facilities within larger geographic area on   Patient informed that his/her managed care company has contracts with or will negotiate with  certain facilities, including the following:   [facility]     Patient/family informed of bed offers received:  12/23/2013 Patient chooses bed at [facility] Physician recommends and patient chooses bed at    Patient to be transferred to [facility] on    Patient to be transferred to facility by  Patient and family notified of transfer on  Name of family member notified:    The following physician request were entered in Epic:   Additional Comments:   Raynaldo Opitz, [place] Social Worker cell #: [phone]

## 2013-12-23 NOTE — Progress Notes (Signed)
[facility] PROGRESS NOTE  William Baker ALP:[id number] DOB: Jun 09, 1925 DOA: 12/16/2013 PCP: [name], MD   Brief narrative: Pt is 80 yo male who presented to [facility] ED 12/16/2013 with main concern of several weeks duration of progressively worsening left hip pain, throbbing and sharp, 10/10 in severity, radiating to the left knee, unable to bear weight, has been mostly bed bound as a result. Pt denied similar events in the past, no traumas to the area, no falls. Per NH records, pt has been ambulating with walker at baseline. No reported fevers.  In emergency department, patient noted to be hemodynamically stable, vital signs stable hip x-ray with no acute bony abnormalities, left hip MRI notable for expansile lytic left iliac bone lesion worrisome for metastatic lesion given the presence of obvious tumor in the right sided urinary bladder.       Assessment/Plan: #1 acute left hip pain secondary to lytic bone lesion likely metastatic disease from bladder cancer Per MRI of the left hip.Patient is status post CT guided biopsy of the left hip would results pending. Patient is status post TURP which showed a high grade invasive bladder carcinoma. SPEP negative for myeloma. CEA is negative. Chemotherapy to be determined based on biopsy findings of the hip. Patient has been seen by radiation oncology will be consulted for palliative radiation to the hip. Continue pain management. Supportive care.  #2 high-grade invasive bladder cancer Status post TURP with biopsy results consistent with high-grade invasive bladder cancer. Due to patient's age and comorbidities has felt patient is not a candidate for cystectomy. Continue Levsin for bladder spasms. Outpatient follow-up with oncology.  #3 BPH Continue Flomax and Proscar.  #4 hypothyroidism Continue Synthroid.  #5 hypertension Stable. Continue hydralazine, Proscar,Flomax.  #6 hyponatremia Secondary to prerenal azotemia.follow.  #7  acute blood loss anemia/anemia of chronic disease Secondary to bladder tumor. Hemoglobin stable. Follow. Continue folic acid multivitamin.  #8 hyperlipidemia Continue statin.  #9 moderate protein calorie malnutrition Secondary to metastatic disease. Nutrition supplementation.  #10 debility Secondary to acute illness. PT/OT. Needs skilled nursing facility.  #11 prophylaxis SCDs for DVT prophylaxis.  Code Status: full Family Communication: updated patient no family present. Disposition Plan: SNF when medically stable.   Consultants:  Oncology: Dr. Jana Hakim 10/ 29 /2015  Urology: Dr. Rhunette Croft grate 12/17/2013  Procedures:  Transurethral resection of bladder tumor 12/18/2013  CT-guided left iliac bone biopsy Dr. Bartholome Bill 12/21/2013  CT abdomen and pelvis 12/17/2013  CT left hip 12/16/2013  Chest x-ray 12/17/2013  X-ray of the left hip 12/16/2013  X-ray of the L-spine 10/ 28/ 2015  MRI left hip 12/16/2013  Antibiotics:  none  HPI/Subjective: Patient states her pain is controlled.  Objective: Filed Vitals:   12/23/13 1330  BP: 143/63  Pulse: 64  Temp: 97.5 F (36.4 C)  Resp: 18    Intake/Output Summary (Last 24 hours) at 12/23/13 1353 Last data filed at 12/23/13 0935  Gross per 24 hour  Intake    600 ml  Output   1850 ml  Net  -1250 ml   Filed Weights   12/17/13 0040  Weight: 60.9 kg (134 lb 4.2 oz)    Exam:   General:  NAD  Cardiovascular: RRR  Respiratory: CTAB  Abdomen: soft, nontender, nondistended, positive bowel sounds.  Musculoskeletal: no clubbing cyanosis or edema  Data Reviewed: Basic Metabolic Panel:  Recent Labs Lab 12/19/13 0517 12/20/13 0400 12/21/13 0353 12/22/13 0433 12/23/13 0330  NA 131* 135* 132* 134* 131*  K 4.6 4.2  4.5 4.0 4.2  CL 96 100 96 97 94*  CO2 22 26 27 26 27   GLUCOSE 149* 90 77 141* 92  BUN 23 27* 30* 23 21  CREATININE 0.83 0.94 0.98 0.86 0.86  CALCIUM 8.7 8.4 8.3* 8.7 8.7   Liver Function  Tests:  Recent Labs Lab 12/17/13 0359  AST 18  ALT 9  ALKPHOS 109  BILITOT 0.4  PROT 6.8  ALBUMIN 3.0*   No results for input(s): LIPASE, AMYLASE in the last 168 hours. No results for input(s): AMMONIA in the last 168 hours. CBC:  Recent Labs Lab 12/19/13 0517 12/20/13 0400 12/21/13 0353 12/22/13 0433 12/22/13 1552  WBC 11.3* 10.8* 9.3 7.9 8.2  HGB 10.2* 9.4* 8.9* 9.4* 10.1*  HCT 30.9* 28.1* 27.5* 28.8* 30.8*  MCV 86.1 85.9 87.0 86.2 84.8  PLT 251 245 229 237 247   Cardiac Enzymes: No results for input(s): CKTOTAL, CKMB, CKMBINDEX, TROPONINI in the last 168 hours. BNP (last 3 results) No results for input(s): PROBNP in the last 8760 hours. CBG:  Recent Labs Lab 12/19/13 0726 12/20/13 0837 12/21/13 0723 12/22/13 0742 12/23/13 0812  GLUCAP 129* 92 102* 89 98    Recent Results (from the past 240 hour(s))  Culture, Urine     Status: None   Collection Time: 12/18/13 12:27 AM  Result Value Ref Range Status   Specimen Description URINE, CLEAN CATCH  Final   Special Requests NONE  Final   Culture  Setup Time   Final    12/18/2013 04:14 Performed at [place]   Final    75,000 COLONIES/ML Performed at [facility]   Culture   Final    Multiple bacterial morphotypes present, none predominant. Suggest appropriate recollection if clinically indicated. Performed at [facility]   Report Status 12/19/2013 FINAL  Final  Surgical pcr screen     Status: None   Collection Time: 12/18/13 10:51 AM  Result Value Ref Range Status   MRSA, PCR NEGATIVE NEGATIVE Final   Staphylococcus aureus NEGATIVE NEGATIVE Final    Comment:        The Xpert SA Assay (FDA approved for NASAL specimens in patients over 56 years of age), is one component of a comprehensive surveillance program.  Test performance has been validated by [employer] for patients greater than or equal to 63 year old. It is not intended to diagnose infection nor  to guide or monitor treatment.  Urine culture     Status: None   Collection Time: 12/18/13 12:24 PM  Result Value Ref Range Status   Specimen Description   Final    URINE, RANDOM TRANSURETHAL RESECTION OF BLADDER TUMOR URINE BLADDER [facility]   Special Requests NONE  Final   Culture  Setup Time   Final    12/19/2013 00:18 Performed at [place] Performed at [facility]   Final   Culture NO GROWTH Performed at [facility]   Final   Report Status 12/20/2013 FINAL  Final     Studies: No results found.  Scheduled Meds: . atorvastatin  10 mg Oral QHS  . calcium carbonate  1 tablet Oral Q breakfast  . cholecalciferol  800 Units Oral Q breakfast  . donepezil  5 mg Oral QHS  . feeding supplement (ENSURE COMPLETE)  237 mL Oral TID  . finasteride  5 mg Oral Q breakfast  . folic acid  1 mg  Oral Daily  . hydrALAZINE  25 mg Oral TID  . levothyroxine  88 mcg Oral QAC breakfast  . mineral oil  5 mL Otic Weekly  . multivitamin with minerals  1 tablet Oral Daily  . naproxen  250 mg Oral BID WC  . nystatin  1 g Topical Daily  . polyethylene glycol  17 g Oral Daily  . psyllium  1 packet Oral QHS  . tamsulosin  0.4 mg Oral Q breakfast  . thiamine  100 mg Oral Daily   Continuous Infusions: . sodium chloride 75 mL/hr at 12/23/13 8850    Principal Problem:   Hip pain Active Problems:   Hypothyroidism   HYPERTENSION, BENIGN SYSTEMIC   Malignant neoplasm of urinary bladder   Neoplasm of bladder   Malnutrition of moderate degree    Time spent: [place] MD [facility] Pager [phone]. If 7PM-7AM, please contact night-coverage at [email or web address], password [facility] 12/23/2013, 1:53 PM  LOS: 7 days

## 2013-12-23 NOTE — Progress Notes (Signed)
  Radiation Oncology         [phone] ________________________________  Name: William Baker MRN: [id number]  Date: 12/23/2013  DOB: 10/04/1925  Simulation Verification Note    ICD-9-CM ICD-10-CM   1. Malignant neoplasm of urinary bladder, unspecified site 188.9 C67.9     Status: inpatient  NARRATIVE: The patient was brought to the treatment unit and placed in the planned treatment position. The clinical setup was verified. Then port films were obtained and uploaded to the radiation oncology medical record software.  The treatment beams were carefully compared against the planned radiation fields. The position location and shape of the radiation fields was reviewed. They targeted volume of tissue appears to be appropriately covered by the radiation beams. Organs at risk appear to be excluded as planned.  Based on my personal review, I approved the simulation verification. The patient's treatment will proceed as planned.  -----------------------------------  Blair Promise, PhD, MD

## 2013-12-24 ENCOUNTER — Telehealth: Payer: Self-pay | Admitting: Oncology

## 2013-12-24 ENCOUNTER — Ambulatory Visit
Admit: 2013-12-24 | Discharge: 2013-12-24 | Disposition: A | Discharge status: Home / Self Care | Payer: Commercial Managed Care - HMO | Attending: Radiation Oncology | Admitting: Radiation Oncology

## 2013-12-24 DIAGNOSIS — N1330 Unspecified hydronephrosis: Secondary | ICD-10-CM | POA: Diagnosis present

## 2013-12-24 LAB — BASIC METABOLIC PANEL
ANION GAP: 8 (ref 5–15)
BUN: 15 mg/dL (ref 6–23)
CHLORIDE: 96 meq/L (ref 96–112)
CO2: 27 meq/L (ref 19–32)
CREATININE: 0.78 mg/dL (ref 0.50–1.35)
Calcium: 8.9 mg/dL (ref 8.4–10.5)
GFR calc Af Amer: >90 mL/min (ref >90)
GFR calc non Af Amer: 79 mL/min — ABNORMAL LOW (ref >90)
GLUCOSE: 104 mg/dL — AB (ref 70–99)
Potassium: 4.4 mEq/L (ref 3.7–5.3)
Sodium: 131 mEq/L — ABNORMAL LOW (ref 137–147)

## 2013-12-24 LAB — GLUCOSE, CAPILLARY: Glucose-Capillary: 98 mg/dL (ref 70–99)

## 2013-12-24 MED ORDER — PHENAZOPYRIDINE HCL 100 MG PO TABS
100.0000 mg | ORAL_TABLET | Freq: Three times a day (TID) | ORAL | Status: DC | PRN
  Filled 2013-12-24 (×3): qty 1

## 2013-12-24 MED ORDER — ADULT MULTIVITAMIN W/MINERALS CH: 1.0000 | ORAL_TABLET | Freq: Every day | ORAL | Status: AC

## 2013-12-24 MED ORDER — FOLIC ACID 1 MG PO TABS: 1.0000 mg | ORAL_TABLET | Freq: Every day | ORAL | Status: DC

## 2013-12-24 MED ORDER — HYOSCYAMINE SULFATE 0.125 MG SL SUBL: 0.2500 mg | SUBLINGUAL_TABLET | SUBLINGUAL | Status: AC | PRN

## 2013-12-24 MED ORDER — PHENAZOPYRIDINE HCL 100 MG PO TABS: 100.0000 mg | ORAL_TABLET | Freq: Three times a day (TID) | ORAL | Status: DC | PRN

## 2013-12-24 MED ORDER — OXYCODONE HCL 5 MG PO TABS: 5.0000 mg | ORAL_TABLET | ORAL | Status: DC | PRN

## 2013-12-24 MED ORDER — THIAMINE HCL 100 MG PO TABS: 100.0000 mg | ORAL_TABLET | Freq: Every day | ORAL | Status: AC

## 2013-12-24 NOTE — Progress Notes (Signed)
Patient is set to discharge to [facility] today. Patient & brother, [name] aware. Discharge packet given to RN, Joellen Jersey. [facility] called for transport.   Clinical Social Work Department CLINICAL SOCIAL WORK PLACEMENT NOTE 12/24/2013  Patient:  William Baker, William Baker  Account Number:  [id number] Admit date:  12/16/2013  Clinical Social Worker:  Renold Genta  Date/time:  12/23/2013 10:56 AM  Clinical Social Work is seeking post-discharge placement for this patient at the following level of care:   SKILLED NURSING   (*CSW will update this form in Epic as items are completed)   12/23/2013  Patient/family provided with [place] Department of Clinical Social Work's list of facilities offering this level of care within the geographic area requested by the patient (or if unable, by the patient's family).  12/23/2013  Patient/family informed of their freedom to choose among providers that offer the needed level of care, that participate in Medicare, Medicaid or managed care program needed by the patient, have an available bed and are willing to accept the patient.  12/23/2013  Patient/family informed of [facility]' ownership interest in [facility], as well as of the fact that they are under no obligation to receive care at this facility.  PASARR submitted to EDS on 12/23/2013 PASARR number received on 12/23/2013  FL2 transmitted to all facilities in geographic area requested by pt/family on  12/23/2013 FL2 transmitted to all facilities within larger geographic area on   Patient informed that his/her managed care company has contracts with or will negotiate with  certain facilities, including the following:   [facility]     Patient/family informed of bed offers received:  12/23/2013 Patient chooses bed at [facility] Physician recommends and patient chooses bed at    Patient to be transferred to [facility] on   12/24/2013 Patient to be transferred to facility by [facility] Patient and family notified of transfer on 12/24/2013 Name of family member notified:  Patient's brother, [name] via phone  The following physician request were entered in Epic:   Additional Comments:   Raynaldo Opitz, [place] Social Worker cell #: [phone]

## 2013-12-24 NOTE — Progress Notes (Signed)
Pt d/c to [facility]. Attempted to give report to facility twice, but was unable to reach the nurse. Pt ready to go. Brother informed.

## 2013-12-24 NOTE — Discharge Summary (Addendum)
Physician Discharge Summary  William Baker QQV:[id number] DOB: [birth year] DOA: 12/16/2013  PCP: [name], MD  Admit date: 12/16/2013 Discharge date: 12/24/2013  Time spent: 70 minutes  Recommendations for Outpatient Follow-up:  1. Follow-up with Dr. Junious Silk on 01/06/2014 for follow-up of metastatic bladder carcinoma and right renal atrophy and hydronephrosis. Patient need a basic metabolic profile done to follow-up on electrolytes and renal function. 2. Follow-up with Dr. Alen Blew of oncology on 12/31/2013.on follow-up biopsy results of left hip will need to be followed up upon which showed metastatic poorly differentiated carcinoma. Patient's bladder cancer also need to be followed up upon. Patient will need a CBC and a basic metabolic profile done on follow-up. 3. Follow-up with M.D. at skilled nursing facility.  Discharge Diagnoses:  Principal Problem:   Hip pain Active Problems:   Hypothyroidism   HYPERTENSION, BENIGN SYSTEMIC   Malignant neoplasm of urinary bladder   Neoplasm of bladder   Malnutrition of moderate degree   Hydronephrosis of right kidney   Discharge Condition: stable and improved  Diet recommendation: regular  Filed Weights   12/17/13 0040  Weight: 60.9 kg (134 lb 4.2 oz)    History of present illness:  Pt is 80 yo male who presented to [facility] ED 12/16/2013 with main concern of several weeks duration of progressively worsening left hip pain, throbbing and sharp, 10/10 in severity, radiating to the left knee, unable to bear weight, has been mostly bed bound as a result. Pt denied similar events in the past, no traumas to the area, no falls. Per NH records, pt has been ambulating with walker at baseline. No reported fevers.  In emergency department, patient noted to be hemodynamically stable, vital signs stable hip x-ray with no acute bony abnormalities, left hip MRI notable for expansile lytic left iliac bone lesion worrisome for metastatic lesion given the  presence of obvious tumor in the right sided urinary bladder.   Hospital Course:  #1 acute left hip pain secondary to lytic bone lesion likely metastatic disease from bladder cancer Per MRI of the left hip.Patient is status post CT guided biopsy of the left hip with biopsy results showing metastatic poorly differentiated carcinoma. Patient is status post TURP which showed a high grade invasive bladder carcinoma. SPEP negative for myeloma. CEA is negative. Chemotherapy to be determined based on biopsy findings of the hip. Patient has been seen by radiation oncology and underwent palliative radiation and will complete course of palliative radiation as outpatient. Patient will also follow-up with oncology as outpatient.  #2 high-grade invasive bladder cancer/right renal atrophy and hydronephrosis Patient was noted on imaging to have a bladder mass in the setting of a large mass along the left iliac bone concerning for metastatic cancer. Patient was also noted to have a gross hematuria occurring for the past week prior to admission with some associated bladder discomfort and pain on urination. Urology consultation was obtained, patient was seen in consultation by Dr. Junious Silk. It was recommended per urology the patient undergo a TURP for both tissue diagnosis and management of his hematuria. Patient subsequently underwent TURP with biopsy results consistent with high-grade invasive bladder cancer. Due to patient's age and comorbidities has felt patient is not a candidate for cystectomy. Continue Levsin for bladder spasms. Patient was also started on Pyridium for dysuria. Patient was also noted to have her right renal atrophy and hydronephrosis. It was felt that as patient's renal function was normal and he was asymptomatic without right flank pain placement of the stent  could add significant discomfort and it was elected per urology to continue surveillance with outpatient follow-up. Patient will follow-up with  urology as outpatient.Outpatient follow-up with oncology.  #3 BPH Continue Flomax and Proscar.  #4 hypothyroidism Continued on home regimen Synthroid.  #5 hypertension Stable. Patient was went pain on hydralazine, Proscar,Flomax.  #6 hyponatremia Secondary to prerenal azotemia. Patient was hydrated with IV fluids and patient's Lasix held. Patient's hyponatremia improved. Outpatient follow-up.  #7 acute blood loss anemia/anemia of chronic disease Secondary to bladder tumor. Hemoglobin stable. Patient was maintained on folic acid, thiamine and multivitamin.  #8 hyperlipidemia Continued on statin.  #9 moderate protein calorie malnutrition Secondary to metastatic disease. Nutrition supplementation. Patient's diet has been liberalized.  #10 debility Secondary to acute illness. PT/OT. Needs skilled nursing facility. Patient to be d/c'd to SNF.   Procedures:  Transurethral resection of bladder tumor 12/18/2013  CT-guided left iliac bone biopsy Dr. Bartholome Bill 12/21/2013  CT abdomen and pelvis 12/17/2013  CT left hip 12/16/2013  Chest x-ray 12/17/2013  X-ray of the left hip 12/16/2013  X-ray of the L-spine 10/ 28/ 2015  MRI left hip 12/16/2013    Consultations:  Oncology: Dr. Jana Hakim 10/ 29 /2015  Urology: Dr. Junious Silk 12/17/2013  Radiation/Oncology : Dr Sondra Come 12/17/13  Discharge Exam: Filed Vitals:   12/24/13 1422  BP: 118/51  Pulse: 65  Temp: 97.5 F (36.4 C)  Resp: 18    General: NAD Cardiovascular: RRR Respiratory: CTAB  Discharge Instructions You were cared for by a hospitalist during your hospital stay. If you have any questions about your discharge medications or the care you received while you were in the hospital after you are discharged, you can call the unit and asked to speak with the hospitalist on call if the hospitalist that took care of you is not available. Once you are discharged, your primary care physician will handle any further medical  issues. Please note that NO REFILLS for any discharge medications will be authorized once you are discharged, as it is imperative that you return to your primary care physician (or establish a relationship with a primary care physician if you do not have one) for your aftercare needs so that they can reassess your need for medications and monitor your lab values.  Discharge Instructions    Diet general    Complete by:  As directed      Discharge instructions    Complete by:  As directed   Follow up with Dr Alen Blew as scheduled. Follow up with Dr Junious Silk as scheduled. Follow up with Dr Sondra Come for outpatient radiation therapy     Increase activity slowly    Complete by:  As directed           Current Discharge Medication List    START taking these medications   Details  folic acid (FOLVITE) 1 MG tablet Take 1 tablet (1 mg total) by mouth daily.    hyoscyamine (LEVSIN SL) 0.125 MG SL tablet Place 2 tablets (0.25 mg total) under the tongue every 4 (four) hours as needed (Bladder spasms/irritation). Qty: 30 tablet, Refills: 0    Multiple Vitamin (MULTIVITAMIN WITH MINERALS) TABS tablet Take 1 tablet by mouth daily.    oxyCODONE (OXY IR/ROXICODONE) 5 MG immediate release tablet Take 1 tablet (5 mg total) by mouth every 4 (four) hours as needed for moderate pain. Qty: 20 tablet, Refills: 0    phenazopyridine (PYRIDIUM) 100 MG tablet Take 1 tablet (100 mg total) by mouth 3 (three) times daily  with meals as needed (dysuria). Qty: 20 tablet, Refills: 0    thiamine 100 MG tablet Take 1 tablet (100 mg total) by mouth daily.      CONTINUE these medications which have NOT CHANGED   Details  acetaminophen (TYLENOL) 325 MG tablet Take 650 mg by mouth every 6 (six) hours as needed (for pain).    atorvastatin (LIPITOR) 10 MG tablet Take 10 mg by mouth at bedtime.    calcium carbonate (OS-CAL - DOSED IN MG OF ELEMENTAL CALCIUM) 1250 MG tablet Take 1 tablet by mouth daily with breakfast.     cholecalciferol (VITAMIN D) 400 UNITS TABS tablet Take 800 Units by mouth daily with breakfast.    donepezil (ARICEPT) 5 MG tablet Take 5 mg by mouth at bedtime.    ENSURE (ENSURE) Take 237 mLs by mouth 3 (three) times daily. Patient drinks Vanilla flavor    finasteride (PROSCAR) 5 MG tablet Take 5 mg by mouth daily with breakfast.    Foot Care Products (SCHOLLS BUNION CUSHIONS) PADS Apply 1 applicator topically 3 (three) times a week.    furosemide (LASIX) 40 MG tablet Take 40 mg by mouth daily with breakfast.    hydrALAZINE (APRESOLINE) 25 MG tablet Take 25 mg by mouth 3 (three) times daily.    levothyroxine (SYNTHROID, LEVOTHROID) 88 MCG tablet Take 88 mcg by mouth daily before breakfast.    mineral oil liquid Place 5 mLs into the right ear once a week. On Tuesday's    naproxen sodium (ANAPROX) 220 MG tablet Take 220 mg by mouth 2 (two) times daily with a meal.    nystatin (MYCOSTATIN/NYSTOP) 100000 UNIT/GM POWD Apply 1 g topically daily.    polyethylene glycol (MIRALAX / GLYCOLAX) packet Take 17 g by mouth daily.    psyllium (REGULOID) 0.52 G capsule Take 0.52 g by mouth at bedtime.    tamsulosin (FLOMAX) 0.4 MG CAPS capsule Take 0.4 mg by mouth daily with breakfast.       Allergies  Allergen Reactions  . Hydrochlorothiazide Other (See Comments)    Hyponatremia   Follow-up Information    Follow up with Festus Aloe, MD On 01/06/2014.   Specialty:  Urology   Why:  f/u 01/06/14 at 415pm   Contact information:   [place] [address] [phone]       Follow up with [facility], MD On 12/31/2013.   Specialty:  Oncology   Why:  f/u on 12/31/13 at 130pm   Contact information:   [address] [place] [address] [phone]       Please follow up.   Why:  f/u with MD at SNF.       The results of significant diagnostics from this hospitalization (including imaging, microbiology, ancillary and laboratory) are listed below for reference.     Significant Diagnostic Studies: Ct Abdomen Pelvis W Wo Contrast  12/17/2013   CLINICAL DATA:  Evaluate bladder mass.  EXAM: CT ABDOMEN AND PELVIS WITHOUT AND WITH CONTRAST  TECHNIQUE: Multidetector CT imaging of the abdomen and pelvis was performed following the standard protocol before and following the bolus administration of intravenous contrast.  CONTRAST:  16mL OMNIPAQUE IOHEXOL 300 MG/ML  SOLN  COMPARISON:  05/07/2006  FINDINGS: Atelectasis or scarring at the right lung base. Coronary artery calcifications. Enlarged heart. Intrathoracic stomach.  No appreciable abnormality of the liver, biliary system, spleen. Fatty atrophy of the pancreas. No adrenal lesion. Unremarkable left kidney.  Right kidney is mildly atrophic. There is severe  right hydroureteronephrosis to the level of the bladder.  Moderate stool burden. Redundant sigmoid colon. No overt colitis. Appendix within normal limits. Small bowel loops are normal caliber. Similar to prior, several small bowel loops are positioned lateral to the descending colon. No free intraperitoneal air or fluid. No lymphadenopathy.  Scattered atherosclerotic disease of the aorta and branch vessels without aneurysmal dilatation.  Circumferential bladder wall thickening. Irregular mass at the right posterior bladder wall inseparable from the UVJ measuring up to 3.4 x 2.4 cm on series 5, image 60. Normal size prostate gland.  Lytic lesions left iliac bone and T11 vertebral body. Diffuse osteopenia.  IMPRESSION: Severe right hydroureteronephrosis to the level of a posterolateral right bladder mass inseparable from the UVJ.  Lytic lesions left iliac bone and T11 vertebral body, in keeping with metastatic disease.   Electronically Signed   By: Carlos Levering M.D.   On: 12/17/2013 23:11   Dg Chest 2 View  12/18/2013   CLINICAL DATA:  Bladder mass. CHF history, history of pulmonary hypertension.  EXAM: CHEST  2 VIEW  COMPARISON:  05/17/2006  FINDINGS: Heart size  upper normal. Mild left lung base opacity. Elevated left hemidiaphragm. Intrathoracic stomach. Hyperinflation. Interstitial coarsening. No pneumothorax. No definite pleural effusion. Osteopenia. Lytic lesions are not excluded in this patient with known T11 and iliac bone metastases.  IMPRESSION: Intrathoracic stomach.  Mild left lung base opacity, favor scarring.  COPD.   Electronically Signed   By: Carlos Levering M.D.   On: 12/18/2013 01:49   Dg Lumbar Spine Complete  12/16/2013   CLINICAL DATA:  Left hip pain, low back pain.  No known injury.  EXAM: LUMBAR SPINE - COMPLETE 4+ VIEW  COMPARISON:  CT of the abdomen and pelvis 05/07/2006  FINDINGS: Diffuse osteopenia. Postoperative changes in the lumbar spine. Normal alignment. No fracture. Mild degenerative facet disease throughout the lumbar spine. Early degenerative spurring anteriorly.  Diffuse aortic calcifications without visible aneurysm.  IMPRESSION: No acute bony abnormality.   Electronically Signed   By: Rolm Baptise M.D.   On: 12/16/2013 13:39   Dg Hip Complete Left  12/16/2013   CLINICAL DATA:  Left hip pain without injury.  EXAM: LEFT HIP - COMPLETE 2+ VIEW  COMPARISON:  None.  FINDINGS: There is no evidence of hip fracture or dislocation. There is no evidence of arthropathy or other focal bone abnormality.  IMPRESSION: Normal left hip.   Electronically Signed   By: Sabino Dick M.D.   On: 12/16/2013 13:41   Mr Hip Left W Wo Contrast  12/16/2013   CLINICAL DATA:  Left hip pain for 2 weeks increasing with weight-bearing. CT scan revealed a mass in the left iliac bone.  EXAM: MRI OF THE LEFT HIP WITHOUT AND WITH CONTRAST  TECHNIQUE: Multiplanar, multisequence MR imaging was performed both before and after administration of intravenous contrast.  CONTRAST:  86mL MULTIHANCE GADOBENATE DIMEGLUMINE 529 MG/ML IV SOLN  COMPARISON:  12/16/2013  FINDINGS: Extensive abnormal marrow infiltration with edema and enhancement along with a lytic expansile  lesion of the left iliac bone measuring 6.7 by 2.8 by 5.6 cm, with a small amount of extraosseous spread tracking deep to the posterior portion of the gluteus minimus and possibly deep to the iliacus muscle. No sciatic notch impingement or direct impingement on the sciatic nerve. No obturator impingement due to this process.  There is a right hydroureter extending down to the UVJ. In the vicinity of the right UVJ, there is a polypoid 2.6 cm  enhancing mass sitting atop a sessile 6.1 by 3.4 by 1.8 cm bladder mass along the urothelium. I do not see a similar filling defect in the visualized portion of the right ureter itself.  There is abnormal edema tracking through the soft tissues below the right ischium to the skin surface. No proximal femoral lesion is observed.  IMPRESSION:  1. Expansile lytic left iliac bone lesion with early extraosseous spread posteriorly and anteriorly, extending to the superior acetabular margin. No hip effusion or synovial enhancement in the hip joint. Appearance compatible with a metastatic lesion given the presence of the obvious tumor in the right-sided the urinary bladder is with sessile and polypoid components favoring transitional cell carcinoma. Conceivably this could represent superimposed myeloma or fibrous dysplasia but a metastatic lesion is strongly favored. I do not see an other bony lesion in the pelvis or proximal femurs.   Electronically Signed   By: Sherryl Barters M.D.   On: 12/16/2013 20:32   Ct Hip Left Wo Contrast  12/16/2013   CLINICAL DATA:  LEFT leg pain. Pain extending from hip to the foot. Initial encounter. 8/10 hip pain.  EXAM: CT OF THE LEFT HIP WITHOUT CONTRAST  TECHNIQUE: Multidetector CT imaging of the left hip was performed according to the standard protocol. Multiplanar CT image reconstructions were also generated.  COMPARISON:  Radiographs earlier today.  FINDINGS: There is no hip fracture. Severe atherosclerosis. The LEFT obturator ring appears  intact. The hip is mildly flexed. No hip effusion is evident.  Partial visualization of the anatomic pelvis shows an exophytic nodule in the bladder base, with some calcifications. Bladder tumor such as transitional cell carcinoma cannot be excluded. Followup urology consultation should be considered.  There is soft tissue attenuation in the marrow space of the LEFT supra-acetabular ilium, along with . Although this could represent active red marrow and superimposed osteopenia, on osteolytic lesion cannot be excluded. This area is only partially visible. If the patient is a candidate for MRI, that would be the preferred modality for further evaluation with and without contrast. Notably, marrow attenuation in other osteopenic bones appears normal and fatty.  IMPRESSION: 1. No acute osseous abnormality.  Negative for hip fracture. 2. Abnormal LEFT supra-acetabular ilium marrow space, suspicious for an osteolytic lesion although osteopenia and superimposed active red marrow could mimic this appearance. Followup MRI with and without contrast recommended if there are no contraindications. 3. Calcified nodule in the bladder base is partially visible. Followup urology consultation recommended.   Electronically Signed   By: Dereck Ligas M.D.   On: 12/16/2013 14:55   Ct Biopsy  12/21/2013   CLINICAL DATA:  Left iliac lytic bone lesion  EXAM: CT-GUIDED BIOPSY OF A LEFT ILIAC BONE LESION.  CORE.  MEDICATIONS AND MEDICAL HISTORY: Versed 1 mg, Fentanyl 50 mcg.  Additional Medications: None.  ANESTHESIA/SEDATION: Moderate sedation time: 10 minutes  PROCEDURE: The procedure, risks, benefits, and alternatives were explained to the patient. Questions regarding the procedure were encouraged and answered. The patient understands and consents to the procedure.  The left hip was prepped with Betadine in a sterile fashion, and a sterile drape was applied covering the operative field. A sterile gown and sterile gloves were used for  the procedure.  Under CT guidance, a(n) 17 gauge guide needle was advanced into the left iliac bone lesion. Subsequently 4 18 gauge core biopsies were obtained. The guide needle was removed. Final imaging was performed.  Patient tolerated the procedure well without complication. Vital sign monitoring  by nursing staff during the procedure will continue as patient is in the special procedures unit for post procedure observation.  FINDINGS: The images document guide needle placement within the left iliac bone lesion. Post biopsy images demonstrate no hemorrhage.  IMPRESSION: Successful CT-guided core biopsy of a left iliac bone lesion.   Electronically Signed   By: Maryclare Bean M.D.   On: 12/21/2013 16:19    Microbiology: Recent Results (from the past 240 hour(s))  Culture, Urine     Status: None   Collection Time: 12/18/13 12:27 AM  Result Value Ref Range Status   Specimen Description URINE, CLEAN CATCH  Final   Special Requests NONE  Final   Culture  Setup Time   Final    12/18/2013 04:14 Performed at [place]   Final    75,000 COLONIES/ML Performed at [facility]   Culture   Final    Multiple bacterial morphotypes present, none predominant. Suggest appropriate recollection if clinically indicated. Performed at [facility]   Report Status 12/19/2013 FINAL  Final  Surgical pcr screen     Status: None   Collection Time: 12/18/13 10:51 AM  Result Value Ref Range Status   MRSA, PCR NEGATIVE NEGATIVE Final   Staphylococcus aureus NEGATIVE NEGATIVE Final    Comment:        The Xpert SA Assay (FDA approved for NASAL specimens in patients over 17 years of age), is one component of a comprehensive surveillance program.  Test performance has been validated by [employer] for patients greater than or equal to 26 year old. It is not intended to diagnose infection nor to guide or monitor treatment.  Urine culture     Status: None   Collection Time:  12/18/13 12:24 PM  Result Value Ref Range Status   Specimen Description   Final    URINE, RANDOM TRANSURETHAL RESECTION OF BLADDER TUMOR URINE BLADDER [place]   Special Requests NONE  Final   Culture  Setup Time   Final    12/19/2013 00:18 Performed at [place] Performed at [facility]   Final   Culture NO GROWTH Performed at [facility]   Final   Report Status 12/20/2013 FINAL  Final     Labs: Basic Metabolic Panel:  Recent Labs Lab 12/20/13 0400 12/21/13 0353 12/22/13 0433 12/23/13 0330 12/24/13 0810  NA 135* 132* 134* 131* 131*  K 4.2 4.5 4.0 4.2 4.4  CL 100 96 97 94* 96  CO2 26 27 26 27 27   GLUCOSE 90 77 141* 92 104*  BUN 27* 30* 23 21 15   CREATININE 0.94 0.98 0.86 0.86 0.78  CALCIUM 8.4 8.3* 8.7 8.7 8.9   Liver Function Tests: No results for input(s): AST, ALT, ALKPHOS, BILITOT, PROT, ALBUMIN in the last 168 hours. No results for input(s): LIPASE, AMYLASE in the last 168 hours. No results for input(s): AMMONIA in the last 168 hours. CBC:  Recent Labs Lab 12/19/13 0517 12/20/13 0400 12/21/13 0353 12/22/13 0433 12/22/13 1552  WBC 11.3* 10.8* 9.3 7.9 8.2  HGB 10.2* 9.4* 8.9* 9.4* 10.1*  HCT 30.9* 28.1* 27.5* 28.8* 30.8*  MCV 86.1 85.9 87.0 86.2 84.8  PLT 251 245 229 237 247   Cardiac Enzymes: No results for input(s): CKTOTAL, CKMB, CKMBINDEX, TROPONINI in the last 168 hours. BNP: BNP (last 3 results) No results for input(s): PROBNP in the last 8760 hours. CBG:  Recent  Labs Lab 12/20/13 0837 12/21/13 0723 12/22/13 0742 12/23/13 0812 12/24/13 0757  GLUCAP 92 102* 89 98 98       Signed:  THOMPSON,DANIEL MD [facility] 12/24/2013, 2:46 PM

## 2013-12-24 NOTE — Progress Notes (Signed)
[facility] Therapy Treatment Record Phone [phone]   Radiation Therapy was administered to William Baker on: 12/24/2013  12:17 PM and was treatment # 2 out of a planned course of 10 treatments.

## 2013-12-24 NOTE — Telephone Encounter (Signed)
PATIENT SCHEDULED TO SEE DR.SHADAD 11/12 @ 1:30 W/DR. SHADAD. PATIENT WILL BE D/C TO [facility] [phone]

## 2013-12-24 NOTE — Progress Notes (Signed)
Came back to visit patient at bedside to discuss [place] [facility] services. Patient endorses he is going to SNF at discharge. Discussed earlier that patient was being seen by Dr Fredderick Phenix prior at his ALF. However, patient to go to a different facility at discharge and it is likely his Primary Care MD will change again. Therefore, Primary Care MD change form was not completed. Consents signed for [place] [facility] on behalf of his [employer]. [facility] Licensed CSW will follow up with patient at facility. Discussed with inpatient RNCM. Left [facility] Care Management packet at bedside. [facility] [phone]

## 2013-12-24 NOTE — Progress Notes (Signed)
Patient ID: William Baker, male   DOB: 1925/08/07, 80 y.o.   MRN: [id number]  The patient complains of some dysuria.  He is voiding without other difficulty.  His creatinine is normal at 0.78.  Filed Vitals:   12/24/13 0424  BP: 140/66  Pulse: 61  Temp: 97.7 F (36.5 C)  Resp: 18     Intake/Output Summary (Last 24 hours) at 12/24/13 5883 Last data filed at 12/24/13 0755  Gross per 24 hour  Intake   1230 ml  Output   1400 ml  Net   -170 ml     Likely stage IV bladder cancer, right hydronephrosis with right renal atrophy -  Plan: -I added Pyridium for dysuria -I discussed with the patient the right renal atrophy and hydronephrosis and the nature risk and benefits of surveillance or right percutaneous nephrostomy tube placement with antegrade placement of stent.  All questions answered.  Given that his kidney function is normal, he is not having right flank pain and a stent could add significant discomfort we elected to continue surveillance. -I'll see the patient back in the office in 3-4 weeks.  He may need repeat TUR in the future if / when the cancer recurs and causes local symptoms such as gross hematuria.  -will sign off, call with questions

## 2013-12-24 NOTE — Progress Notes (Signed)
Physical Therapy Treatment Patient Details Name: William Baker MRN: [id number] DOB: 1925/07/18 Today's Date: 12/24/2013    History of Present Illness Pt is an 80 year old male admitted for acute left hip pain in the setting of expansile lytic bone lesion probable metastatic disease from bladder carcinoma.  Pt as been mostly in bed since hip pain increased however typically ambulates with RW at baseline from ALF.    PT Comments    Progressing slowly with mobility. Took a few steps with RW-very unsteady. At high risk for falls. Continue to recommend SNF for continued rehab.   Follow Up Recommendations  SNF     Equipment Recommendations  None recommended by PT    Recommendations for Other Services       Precautions / Restrictions Precautions Precautions: Fall Restrictions Weight Bearing Restrictions: No    Mobility  Bed Mobility Overal bed mobility: Needs Assistance Bed Mobility: Supine to Sit;Sit to Supine     Supine to sit: Mod assist;+2 for physical assistance;+2 for safety/equipment;HOB elevated Sit to supine: Mod assist;+2 for physical assistance;+2 for safety/equipment;HOB elevated   General bed mobility comments: pt able to initiate movement, difficulty scooting to EOB so utilized bed pad. Increased time. Assist for trunk and bil LEs.   Transfers Overall transfer level: Needs assistance Equipment used: Rolling walker (2 wheeled) Transfers: Sit to/from Stand Sit to Stand: Mod assist;+2 physical assistance;+2 safety/equipment;From elevated surface         General transfer comment: Assist to rise, stabilize, control descent. Pt stood but maintained flexed posture. Multimodal cueing for posture.   Ambulation/Gait     Assistive device: Rolling walker (2 wheeled)       General Gait Details: 4-5 side step to HOb with RW. Assist to stabilize and maneuver with walker. Pt reported some pain in L hip and stated he felt like leg may "give way".    Stairs             Wheelchair Mobility    Modified Rankin (Stroke Patients Only)       Balance Overall balance assessment: Needs assistance Sitting-balance support: Bilateral upper extremity supported;Feet supported Sitting balance-Leahy Scale: Fair     Standing balance support: Bilateral upper extremity supported;During functional activity Standing balance-Leahy Scale: Poor                      Cognition Arousal/Alertness: Awake/alert Behavior During Therapy: WFL for tasks assessed/performed Overall Cognitive Status: Within Functional Limits for tasks assessed                      Exercises      General Comments        Pertinent Vitals/Pain Pain Assessment: Faces Faces Pain Scale: Hurts little more Pain Location: L hip Pain Intervention(s): Monitored during session;Limited activity within patient's tolerance;Repositioned    Home Living                      Prior Function            PT Goals (current goals can now be found in the care plan section) Progress towards PT goals: Progressing toward goals (slowly)    Frequency  Min 3X/week    PT Plan Current plan remains appropriate    Co-evaluation             End of Session Equipment Utilized During Treatment: Gait belt Activity Tolerance: Patient limited by fatigue;Patient limited by pain Patient left:  in bed;with call bell/phone within reach;with bed alarm set     Time: 1000-1026 PT Time Calculation (min): 26 min  Charges:  $Therapeutic Activity: 23-37 mins                    G Codes:      Weston Anna, MPT Pager: [phone]

## 2013-12-25 ENCOUNTER — Ambulatory Visit: Payer: Commercial Managed Care - HMO

## 2013-12-25 ENCOUNTER — Telehealth: Payer: Self-pay | Admitting: Oncology

## 2013-12-25 NOTE — Telephone Encounter (Signed)
Called [facility] regarding William Baker's appointment today.  Advised them that he has an 11:00 appointment for radiation today.  They stated that they were unaware of the appointment and will try to arrange transportation.  Also advised him of his appointment times for the rest of his treatment.  They will call back to let us know when he can come today.

## 2013-12-25 NOTE — Telephone Encounter (Signed)
Sabrina from [facility] called and said they will not be able to bring William Baker today.  He does not have any clothes except for this hospital gown.  His family is bringing clothes this afternoon.  They would like to cancel his appointment today.  They are aware of his appointment time on Monday and will be able to bring him then.  Notified Jennifer, RT on [address].

## 2013-12-28 ENCOUNTER — Telehealth: Payer: Self-pay | Admitting: Oncology

## 2013-12-28 ENCOUNTER — Ambulatory Visit: Payer: Commercial Managed Care - HMO

## 2013-12-28 NOTE — Telephone Encounter (Signed)
Called [facility] because William Baker did not show up for treatment.  Per [facility] they will have to reschedule.  Transferred them to [place], RT on [address].

## 2013-12-29 ENCOUNTER — Ambulatory Visit
Admit: 2013-12-29 | Discharge: 2013-12-29 | Disposition: A | Discharge status: Home / Self Care | Payer: Commercial Managed Care - HMO | Attending: Radiation Oncology | Admitting: Radiation Oncology

## 2013-12-29 ENCOUNTER — Telehealth: Payer: Self-pay | Admitting: Oncology

## 2013-12-29 DIAGNOSIS — C7989 Secondary malignant neoplasm of other specified sites: Secondary | ICD-10-CM | POA: Diagnosis not present

## 2013-12-29 DIAGNOSIS — C678 Malignant neoplasm of overlapping sites of bladder: Secondary | ICD-10-CM | POA: Diagnosis not present

## 2013-12-29 DIAGNOSIS — Z510 Encounter for antineoplastic radiation therapy: Secondary | ICD-10-CM | POA: Diagnosis not present

## 2013-12-29 NOTE — Telephone Encounter (Signed)
Per Hassan Rowan with transportation at [facility], Mr. Moccia is scheduled to come for treatment today at 11:45.

## 2013-12-30 ENCOUNTER — Telehealth: Payer: Self-pay | Admitting: Medical Oncology

## 2013-12-30 ENCOUNTER — Ambulatory Visit
Admission: RE | Admit: 2013-12-30 | Discharge: 2013-12-30 | Disposition: A | Discharge status: Home / Self Care | Payer: Medicare HMO | Source: Ambulatory Visit | Attending: Radiation Oncology | Admitting: Radiation Oncology

## 2013-12-30 ENCOUNTER — Ambulatory Visit
Admit: 2013-12-30 | Discharge: 2013-12-30 | Disposition: A | Discharge status: Home / Self Care | Payer: Commercial Managed Care - HMO | Attending: Radiation Oncology | Admitting: Radiation Oncology

## 2013-12-30 ENCOUNTER — Encounter: Payer: Self-pay | Admitting: Radiation Oncology

## 2013-12-30 VITALS — BP 124/44 | HR 69 | Temp 97.5°F | Resp 16 | Ht 67.0 in

## 2013-12-30 DIAGNOSIS — C679 Malignant neoplasm of bladder, unspecified: Secondary | ICD-10-CM

## 2013-12-30 DIAGNOSIS — Z510 Encounter for antineoplastic radiation therapy: Secondary | ICD-10-CM | POA: Diagnosis not present

## 2013-12-30 NOTE — Progress Notes (Signed)
  Radiation Oncology         [phone] ________________________________  Name: William Baker MRN: [id number]  Date: 12/30/2013  DOB: 01-May-1925  Weekly Radiation Therapy Management  DIAGNOSIS: stage IV bladder cancer  Current Dose: 12 Gy     Planned Dose:  30 Gy  Narrative . . . . . . . . The patient presents for routine under treatment assessment.                                   The patient is without complaint. He denies any nausea or bowel problems. His pain has improved slightly.                                 Set-up films were reviewed.                                 The chart was checked. Physical Findings. . .  height is 5\' 7"  (1.702 m). His oral temperature is 97.5 F (36.4 C). His blood pressure is 124/44 and his pulse is 69. His respiration is 16 and oxygen saturation is 100%. . The lungs are clear. The heart has a regular rhythm and rate. The abdomen is soft and nontender with normal bowel sounds. The patient is being transferred by wheelchair from [facility]. Impression . . . . . . . The patient is tolerating radiation. Plan . . . . . . . . . . . . Continue treatment as planned.  ________________________________   Blair Promise, PhD, MD

## 2013-12-30 NOTE — Telephone Encounter (Signed)
Call to patient for confirmation of appt tomorrow. Patient currently at [facility], confirmed with them appt.

## 2013-12-30 NOTE — Progress Notes (Signed)
William Baker has completed 4 fractions to his left pelvis.  He reports soreness in his left hip especially with standing.  He reports having incontinence of urine and is wearing depends.  He reports a good appetite and denies nausea.  He is currently living at [facility].  He was given the Radiation Therapy and You book and discussed potential side effects of nausea, fatigue and skin changes.  He was advised to call with any questions or concerns.

## 2013-12-31 ENCOUNTER — Encounter: Payer: Self-pay | Admitting: Oncology

## 2013-12-31 ENCOUNTER — Ambulatory Visit: Payer: Medicare HMO

## 2013-12-31 ENCOUNTER — Other Ambulatory Visit: Payer: Medicare HMO

## 2013-12-31 ENCOUNTER — Telehealth: Payer: Self-pay | Admitting: Oncology

## 2013-12-31 ENCOUNTER — Ambulatory Visit
Admit: 2013-12-31 | Discharge: 2013-12-31 | Disposition: A | Discharge status: Home / Self Care | Payer: Commercial Managed Care - HMO | Attending: Radiation Oncology | Admitting: Radiation Oncology

## 2013-12-31 ENCOUNTER — Ambulatory Visit (HOSPITAL_BASED_OUTPATIENT_CLINIC_OR_DEPARTMENT_OTHER): Payer: Medicare HMO | Admitting: Oncology

## 2013-12-31 VITALS — BP 120/55 | HR 70 | Temp 97.5°F | Resp 18 | Ht 67.0 in

## 2013-12-31 DIAGNOSIS — C7951 Secondary malignant neoplasm of bone: Secondary | ICD-10-CM

## 2013-12-31 DIAGNOSIS — C679 Malignant neoplasm of bladder, unspecified: Secondary | ICD-10-CM

## 2013-12-31 DIAGNOSIS — Z510 Encounter for antineoplastic radiation therapy: Secondary | ICD-10-CM | POA: Diagnosis not present

## 2013-12-31 NOTE — Progress Notes (Signed)
Checked in new pt with no financial concerns. °

## 2013-12-31 NOTE — Progress Notes (Signed)
Hematology and Oncology Follow Up Visit  William Baker [id number] [birth year] 80 y.o. 12/31/2013 2:27 PM William Baker, MDTripp, William Mussel, MD   Principle Diagnosis: 80 year old gentleman diagnosed with stage IV bladder cancer in October 2015. He presented with a large lytic lesion and a 6 cm bladder mass. 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   Prior Therapy: he is status post TURBT on 12/18/2013 with the pathology revealed high-grade urothelial carcinoma.  Current therapy: he is currently receiving palliative radiation therapy to the left hip. Last treatment scheduled for 11/192015.  Interim History: William Baker presents today for a follow-up visit. He is a gentleman diagnosed with stage IV bladder cancer after he presented with hip pain and found to have a large lytic lesion. His imaging studies showed a lytic lesion around left hip as well as T11 vertebral body. He is currently receiving radiation therapy and tolerating it well. He is not reporting any other complaints. He is not reporting any back pain, shoulder pain or any other pain. He has very poor performance status and very limited quality of life. He is currently residing in a skilled nursing facility and for the most part wheelchair-bound. His review of systems was difficult to obtain at this time. His brother accompanied him today and was able to answer some questions regarding his history.  Medications: I have reviewed the patient's current medications.  Current Outpatient Prescriptions  Medication Sig Dispense Refill  . acetaminophen (TYLENOL) 325 MG tablet Take 650 mg by mouth every 6 (six) hours as needed (for pain).    Marland Kitchen atorvastatin (LIPITOR) 10 MG tablet Take 10 mg by mouth at bedtime.    . calcium carbonate (OS-CAL - DOSED IN MG OF ELEMENTAL CALCIUM) 1250 MG tablet Take 1 tablet by mouth daily with breakfast.    . cholecalciferol (VITAMIN D) 400 UNITS TABS tablet  Take 800 Units by mouth daily with breakfast.    . donepezil (ARICEPT) 5 MG tablet Take 5 mg by mouth at bedtime.    . ENSURE (ENSURE) Take 237 mLs by mouth 3 (three) times daily. Patient drinks Vanilla flavor    . finasteride (PROSCAR) 5 MG tablet Take 5 mg by mouth daily with breakfast.    . folic acid (FOLVITE) 1 MG tablet Take 1 tablet (1 mg total) by mouth daily.    . furosemide (LASIX) 40 MG tablet Take 40 mg by mouth daily with breakfast.    . hydrALAZINE (APRESOLINE) 25 MG tablet Take 25 mg by mouth 3 (three) times daily.    . hyoscyamine (LEVSIN SL) 0.125 MG SL tablet Place 2 tablets (0.25 mg total) under the tongue every 4 (four) hours as needed (Bladder spasms/irritation). 30 tablet 0  . levothyroxine (SYNTHROID, LEVOTHROID) 88 MCG tablet Take 88 mcg by mouth daily before breakfast.    . mineral oil liquid Place 5 mLs into the right ear once a week. On Tuesday's    . Multiple Vitamin (MULTIVITAMIN WITH MINERALS) TABS tablet Take 1 tablet by mouth daily.    . naproxen sodium (ANAPROX) 220 MG tablet Take 220 mg by mouth 2 (two) times daily with a meal.    . oxyCODONE (OXY IR/ROXICODONE) 5 MG immediate release tablet Take 1 tablet (5 mg total) by mouth every 4 (four) hours as needed for moderate pain. 20 tablet 0  . phenazopyridine (PYRIDIUM) 100 MG tablet Take 1 tablet (100 mg total) by mouth 3 (three) times daily with meals  as needed (dysuria). 20 tablet 0  . polyethylene glycol (MIRALAX / GLYCOLAX) packet Take 17 g by mouth daily.    . psyllium (REGULOID) 0.52 G capsule Take 0.52 g by mouth at bedtime.    . tamsulosin (FLOMAX) 0.4 MG CAPS capsule Take 0.4 mg by mouth daily with breakfast.    . thiamine 100 MG tablet Take 1 tablet (100 mg total) by mouth daily.    . Foot Care Products [facility] BUNION CUSHIONS) PADS Apply 1 applicator topically 3 (three) times a week.    . nystatin (MYCOSTATIN/NYSTOP) 100000 UNIT/GM POWD Apply 1 g topically daily.     No current facility-administered  medications for this visit.     Allergies:  Allergies  Allergen Reactions  . Hydrochlorothiazide Other (See Comments)    Hyponatremia    Past Medical History, Surgical history, Social history, and Family History were reviewed and updated.   Physical Exam: Blood pressure 120/55, pulse 70, temperature 97.5 F (36.4 C), temperature source Oral, resp. rate 18, height 5\' 7"  (1.702 m). ECOG: 3 General appearance: alert and cooperative Head: Normocephalic, without obvious abnormality Neck: no adenopathy Lymph nodes: Cervical, supraclavicular, and axillary nodes normal. Heart:regular rate and rhythm, S1, S2 normal, no murmur, click, rub or gallop Lung:chest clear, no wheezing, rales, normal symmetric air entry Abdomin: soft, non-tender, without masses or organomegaly EXT:no erythema, induration, or nodules   Lab Results: Lab Results  Component Value Date   WBC 8.2 12/22/2013   HGB 10.1* 12/22/2013   HCT 30.8* 12/22/2013   MCV 84.8 12/22/2013   PLT 247 12/22/2013     Chemistry      Component Value Date/Time   NA 131* 12/24/2013 0810   K 4.4 12/24/2013 0810   CL 96 12/24/2013 0810   CO2 27 12/24/2013 0810   BUN 15 12/24/2013 0810   CREATININE 0.78 12/24/2013 0810      Component Value Date/Time   CALCIUM 8.9 12/24/2013 0810   ALKPHOS 109 12/17/2013 0359   AST 18 12/17/2013 0359   ALT 9 12/17/2013 0359   BILITOT 0.4 12/17/2013 0359       Radiological Studies:  EXAM: CT ABDOMEN AND PELVIS WITHOUT AND WITH CONTRAST  TECHNIQUE: Multidetector CT imaging of the abdomen and pelvis was performed following the standard protocol before and following the bolus administration of intravenous contrast.  CONTRAST: 135mL OMNIPAQUE IOHEXOL 300 MG/ML SOLN  COMPARISON: 05/07/2006  FINDINGS: Atelectasis or scarring at the right lung base. Coronary artery calcifications. Enlarged heart. Intrathoracic stomach.  No appreciable abnormality of the liver, biliary system,  spleen. Fatty atrophy of the pancreas. No adrenal lesion. Unremarkable left kidney.  Right kidney is mildly atrophic. There is severe right hydroureteronephrosis to the level of the bladder.  Moderate stool burden. Redundant sigmoid colon. No overt colitis. Appendix within normal limits. Small bowel loops are normal caliber. Similar to prior, several small bowel loops are positioned lateral to the descending colon. No free intraperitoneal air or fluid. No lymphadenopathy.  Scattered atherosclerotic disease of the aorta and branch vessels without aneurysmal dilatation.  Circumferential bladder wall thickening. Irregular mass at the right posterior bladder wall inseparable from the UVJ measuring up to 3.4 x 2.4 cm on series 5, image 60. Normal size prostate gland.  Lytic lesions left iliac bone and T11 vertebral body. Diffuse osteopenia.  IMPRESSION: Severe right hydroureteronephrosis to the level of a posterolateral right bladder mass inseparable from the UVJ.  Lytic lesions left iliac bone and T11 vertebral body, in keeping with metastatic  disease.   Impression and Plan:   80 year old gentleman with the following issues:  1. Stage IV bladder cancer presented with a large bladder tumor as well as metastatic disease to the left hip and vertebral bodies. He is status post TURBT which confirmed the diagnosis. I discussed these findings extensively with the patient's brother and his wife today. William Baker unfortunately is not a candidate for any further therapy. He is not a candidate for a cystectomy nor it is indicated. He is not a candidate for systemic chemotherapy either. He has poor quality of life and poor performance status. My recommendation at this point is to proceed with supportive care only and address symptoms as they arise. At this point he is symptomatic only from his hip which is currently being treated. Down the line, he may be a candidate for radiation therapy  and other areas showed any tumors arise. He could also be a candidate for a repeat TURBT if develops any bladder bleeding.  Overall, his prognosis is poor with very limited life expectancy. Ultimately he will require hospice and I shared this with the family today.  2. Left hip pain: His pain appears to be under control and improving with radiation therapy.  3. Follow-up: Will be in 3 months sooner if needed to.  Zola Button, MD 11/12/20152:27 PM

## 2013-12-31 NOTE — Telephone Encounter (Signed)
Gave avs & cal for Feb. °

## 2014-01-01 ENCOUNTER — Telehealth: Payer: Self-pay | Admitting: Oncology

## 2014-01-01 ENCOUNTER — Telehealth: Payer: Self-pay | Admitting: *Deleted

## 2014-01-01 ENCOUNTER — Ambulatory Visit: Payer: Commercial Managed Care - HMO

## 2014-01-01 NOTE — Telephone Encounter (Signed)
Spoke with brother Wille Glaser and informed him re:  Per Dr. Alen Blew,  It is fine for pt to return to [facility] after rehab and radiation treatments.  However, md will not be able to intervene with [facility] for pt's roommate being noisy.  Suggested with Joe to discuss noise issue with the director of the home and ask for suggestions.  Joe voiced understanding.

## 2014-01-01 NOTE — Telephone Encounter (Signed)
Notified William Baker that the machine is down and that [facility] [employer] will not be having radiation treatment today.  William Baker verbalized understanding.  Advised her that his treatment will resume on Monday.

## 2014-01-01 NOTE — Telephone Encounter (Signed)
Brother Wille Glaser called and left message wanting to talk to Dr. Alen Blew.  Called Joe back, and was informed that he has some questions for md: 1.   Pt is currently resides at [facility] for rehab now.   The roommate is very noisy - loud TV ; pt could not have quiet time to think about what the next plan is.  Joe wanted to know if Dr. Alen Blew could intervene with the home to see if pt could be moved to another room.  2.   Pt will be returning to [facility] after rehab, and radiation treatments.  Joe wanted to know what Dr. Alen Blew thinks would be the best for pt -  Returning to [facility]. Joe's  Phone   [phone].

## 2014-01-04 ENCOUNTER — Ambulatory Visit: Payer: Commercial Managed Care - HMO

## 2014-01-04 ENCOUNTER — Telehealth: Payer: Self-pay | Admitting: Oncology

## 2014-01-04 NOTE — Telephone Encounter (Signed)
Notified Gabriel Cirri that the machine is down today and that William Baker's treatment has been canceled.  Sabrina verbalized agreement.

## 2014-01-05 ENCOUNTER — Ambulatory Visit: Admission: RE | Admit: 2014-01-05 | Payer: Medicare HMO | Source: Ambulatory Visit | Admitting: Radiation Oncology

## 2014-01-05 ENCOUNTER — Ambulatory Visit
Admission: RE | Admit: 2014-01-05 | Discharge: 2014-01-05 | Disposition: A | Discharge status: Home / Self Care | Payer: Medicare HMO | Source: Ambulatory Visit | Attending: Radiation Oncology | Admitting: Radiation Oncology

## 2014-01-05 ENCOUNTER — Ambulatory Visit: Payer: Commercial Managed Care - HMO

## 2014-01-05 ENCOUNTER — Encounter: Payer: Self-pay | Admitting: *Deleted

## 2014-01-05 ENCOUNTER — Ambulatory Visit
Admission: RE | Admit: 2014-01-05 | Discharge: 2014-01-05 | Disposition: A | Discharge status: Home / Self Care | Payer: Commercial Managed Care - HMO | Source: Ambulatory Visit | Attending: Radiation Oncology | Admitting: Radiation Oncology

## 2014-01-05 DIAGNOSIS — Z510 Encounter for antineoplastic radiation therapy: Secondary | ICD-10-CM | POA: Diagnosis not present

## 2014-01-05 DIAGNOSIS — C679 Malignant neoplasm of bladder, unspecified: Secondary | ICD-10-CM

## 2014-01-06 ENCOUNTER — Encounter: Payer: Self-pay | Admitting: Radiation Oncology

## 2014-01-06 ENCOUNTER — Ambulatory Visit: Payer: Commercial Managed Care - HMO

## 2014-01-06 ENCOUNTER — Ambulatory Visit
Admission: RE | Admit: 2014-01-06 | Discharge: 2014-01-06 | Disposition: A | Discharge status: Home / Self Care | Payer: Commercial Managed Care - HMO | Source: Ambulatory Visit | Attending: Radiation Oncology | Admitting: Radiation Oncology

## 2014-01-06 VITALS — BP 114/46 | HR 68 | Resp 18

## 2014-01-06 DIAGNOSIS — Z510 Encounter for antineoplastic radiation therapy: Secondary | ICD-10-CM | POA: Diagnosis not present

## 2014-01-06 NOTE — Progress Notes (Signed)
  Radiation Oncology         [phone] ________________________________  Name: William Baker MRN: [id number]  Date: 01/06/2014  DOB: May 08, 1925  Weekly Radiation Therapy Management  DIAGNOSIS: stage IV bladder cancer  Current Dose: 21 Gy     Planned Dose:  30 Gy  Narrative . . . . . . . . The patient presents for routine under treatment assessment.                                   The patient is without complaint. His left hip pain is slightly better. The patient did meet with medical oncology. Given his performance status he is not felt to be a candidate for additional therapy after his radiation therapy is complete                                 Set-up films were reviewed.                                 The chart was checked. Physical Findings. . .  blood pressure is 114/46 and his pulse is 68. His respiration is 18. . Weight essentially stable.  The heart has a regular rhythm and rate. The abdomen is soft and nontender with normal bowel sounds. Impression . . . . . . . The patient is tolerating radiation. Plan . . . . . . . . . . . . Continue treatment as planned.  ________________________________   Blair Promise, PhD, MD

## 2014-01-06 NOTE — Progress Notes (Signed)
Patient wheelchair bound but, does reports he is able to stand with assistance. Reports participating in physical therapy at [facility]. Reports intermittent left femur pain worse at night and with certain movements. Reports diarrhea continues. Reports intermittent dysuria. Reports fatigue. Reports numbness and tingling in his left leg. No bilateral lower leg edema noted.

## 2014-01-07 ENCOUNTER — Ambulatory Visit: Payer: Commercial Managed Care - HMO

## 2014-01-07 ENCOUNTER — Ambulatory Visit
Admission: RE | Admit: 2014-01-07 | Discharge: 2014-01-07 | Disposition: A | Discharge status: Home / Self Care | Payer: Commercial Managed Care - HMO | Source: Ambulatory Visit | Attending: Radiation Oncology | Admitting: Radiation Oncology

## 2014-01-07 DIAGNOSIS — Z510 Encounter for antineoplastic radiation therapy: Secondary | ICD-10-CM | POA: Diagnosis not present

## 2014-01-08 ENCOUNTER — Ambulatory Visit
Admission: RE | Admit: 2014-01-08 | Discharge: 2014-01-08 | Disposition: A | Discharge status: Home / Self Care | Payer: Commercial Managed Care - HMO | Source: Ambulatory Visit | Attending: Radiation Oncology | Admitting: Radiation Oncology

## 2014-01-08 ENCOUNTER — Ambulatory Visit: Payer: Commercial Managed Care - HMO

## 2014-01-08 ENCOUNTER — Telehealth: Payer: Self-pay | Admitting: Oncology

## 2014-01-08 DIAGNOSIS — Z510 Encounter for antineoplastic radiation therapy: Secondary | ICD-10-CM | POA: Diagnosis not present

## 2014-01-08 NOTE — Telephone Encounter (Signed)
Called [place] with transportation at [facility].  Verified Mr. Vanscyoc's radiation appointment times with her through 01/11/14.

## 2014-01-11 ENCOUNTER — Ambulatory Visit
Admission: RE | Admit: 2014-01-11 | Discharge: 2014-01-11 | Disposition: A | Discharge status: Home / Self Care | Payer: Commercial Managed Care - HMO | Source: Ambulatory Visit | Attending: Radiation Oncology | Admitting: Radiation Oncology

## 2014-01-11 ENCOUNTER — Ambulatory Visit: Payer: Commercial Managed Care - HMO

## 2014-01-11 DIAGNOSIS — Z510 Encounter for antineoplastic radiation therapy: Secondary | ICD-10-CM | POA: Diagnosis not present

## 2014-01-12 ENCOUNTER — Ambulatory Visit: Payer: Commercial Managed Care - HMO

## 2014-01-13 ENCOUNTER — Telehealth: Payer: Self-pay | Admitting: Oncology

## 2014-01-13 NOTE — Telephone Encounter (Signed)
Called William Baker at [facility] to make sure William Baker is receiving pain medication as needed per a family member's concern.  Per William Baker, they have changed his pain medication to scheduled.

## 2014-01-19 ENCOUNTER — Encounter: Payer: Self-pay | Admitting: Radiation Oncology

## 2014-01-19 NOTE — Progress Notes (Signed)
  Radiation Oncology         [phone] ________________________________  Name: William Baker MRN: [id number]  Date: 01/19/2014  DOB: [birth year]  End of Treatment Note  Diagnosis:    stage IV bladder cancer    Indication for treatment:  Metastasis to the left pelvis and associated pain       Radiation treatment dates:   November 4 through November 23   Site/dose:   Left pelvis 30 gray in 10 fractions  Beams/energy:   Oblique fields, a 15 megavoltage photons  Narrative: The patient tolerated radiation treatment relatively well.   He did have some improvement in his left hip pain at the completion of therapy.  Plan: The patient has completed radiation treatment. The patient will return to radiation oncology clinic for routine followup in one month. I advised them to call or return sooner if they have any questions or concerns related to their recovery or treatment.  -----------------------------------  Blair Promise, PhD, MD

## 2014-02-08 ENCOUNTER — Encounter: Payer: Self-pay | Admitting: Oncology

## 2014-02-10 ENCOUNTER — Ambulatory Visit: Admission: RE | Admit: 2014-02-10 | Payer: Medicare HMO | Source: Ambulatory Visit | Admitting: Radiation Oncology

## 2014-02-10 ENCOUNTER — Telehealth: Payer: Self-pay | Admitting: *Deleted

## 2014-02-10 NOTE — Telephone Encounter (Signed)
CALLED PATIENT TO INFORM THAT DR. KINARD WOULD BE LATE GETTING BACK FROM [facility] TODAY, APPT. HAS BEEN RESCHEDULED TO 03-11-14 @ 10 AM, SPOKE WITH LINDSAY OF [facility] AND SHE IS AWARE OF THIS APPT. CHANGE.

## 2014-02-27 ENCOUNTER — Inpatient Hospital Stay (HOSPITAL_COMMUNITY)
Admission: EM | Admit: 2014-02-27 | Discharge: 2014-03-02 | DRG: 871 | Disposition: A | Discharge status: Hospice | Attending: Internal Medicine | Admitting: Internal Medicine

## 2014-02-27 ENCOUNTER — Emergency Department (HOSPITAL_COMMUNITY)

## 2014-02-27 DIAGNOSIS — E440 Moderate protein-calorie malnutrition: Secondary | ICD-10-CM | POA: Diagnosis present

## 2014-02-27 DIAGNOSIS — K5641 Fecal impaction: Secondary | ICD-10-CM | POA: Insufficient documentation

## 2014-02-27 DIAGNOSIS — R339 Retention of urine, unspecified: Secondary | ICD-10-CM | POA: Diagnosis present

## 2014-02-27 DIAGNOSIS — D649 Anemia, unspecified: Secondary | ICD-10-CM | POA: Diagnosis present

## 2014-02-27 DIAGNOSIS — R410 Disorientation, unspecified: Secondary | ICD-10-CM | POA: Diagnosis not present

## 2014-02-27 DIAGNOSIS — N179 Acute kidney failure, unspecified: Secondary | ICD-10-CM | POA: Diagnosis not present

## 2014-02-27 DIAGNOSIS — H9190 Unspecified hearing loss, unspecified ear: Secondary | ICD-10-CM | POA: Diagnosis present

## 2014-02-27 DIAGNOSIS — I272 Other secondary pulmonary hypertension: Secondary | ICD-10-CM | POA: Diagnosis present

## 2014-02-27 DIAGNOSIS — R569 Unspecified convulsions: Secondary | ICD-10-CM | POA: Diagnosis present

## 2014-02-27 DIAGNOSIS — Z681 Body mass index (BMI) 19 or less, adult: Secondary | ICD-10-CM

## 2014-02-27 DIAGNOSIS — I5032 Chronic diastolic (congestive) heart failure: Secondary | ICD-10-CM | POA: Diagnosis present

## 2014-02-27 DIAGNOSIS — C799 Secondary malignant neoplasm of unspecified site: Secondary | ICD-10-CM | POA: Diagnosis present

## 2014-02-27 DIAGNOSIS — E876 Hypokalemia: Secondary | ICD-10-CM | POA: Diagnosis present

## 2014-02-27 DIAGNOSIS — R6520 Severe sepsis without septic shock: Secondary | ICD-10-CM | POA: Diagnosis present

## 2014-02-27 DIAGNOSIS — R338 Other retention of urine: Secondary | ICD-10-CM | POA: Diagnosis present

## 2014-02-27 DIAGNOSIS — Z515 Encounter for palliative care: Secondary | ICD-10-CM | POA: Insufficient documentation

## 2014-02-27 DIAGNOSIS — C679 Malignant neoplasm of bladder, unspecified: Secondary | ICD-10-CM | POA: Diagnosis present

## 2014-02-27 DIAGNOSIS — G9341 Metabolic encephalopathy: Secondary | ICD-10-CM | POA: Diagnosis not present

## 2014-02-27 DIAGNOSIS — Z888 Allergy status to other drugs, medicaments and biological substances status: Secondary | ICD-10-CM

## 2014-02-27 DIAGNOSIS — E870 Hyperosmolality and hypernatremia: Secondary | ICD-10-CM | POA: Diagnosis present

## 2014-02-27 DIAGNOSIS — E872 Acidosis: Secondary | ICD-10-CM | POA: Diagnosis present

## 2014-02-27 DIAGNOSIS — R69 Illness, unspecified: Secondary | ICD-10-CM | POA: Diagnosis not present

## 2014-02-27 DIAGNOSIS — A419 Sepsis, unspecified organism: Principal | ICD-10-CM | POA: Diagnosis present

## 2014-02-27 DIAGNOSIS — R4182 Altered mental status, unspecified: Secondary | ICD-10-CM | POA: Diagnosis not present

## 2014-02-27 DIAGNOSIS — Z79899 Other long term (current) drug therapy: Secondary | ICD-10-CM

## 2014-02-27 DIAGNOSIS — Z906 Acquired absence of other parts of urinary tract: Secondary | ICD-10-CM | POA: Diagnosis present

## 2014-02-27 DIAGNOSIS — E860 Dehydration: Secondary | ICD-10-CM | POA: Diagnosis present

## 2014-02-27 DIAGNOSIS — N401 Enlarged prostate with lower urinary tract symptoms: Secondary | ICD-10-CM | POA: Diagnosis present

## 2014-02-27 DIAGNOSIS — Z66 Do not resuscitate: Secondary | ICD-10-CM | POA: Diagnosis present

## 2014-02-27 DIAGNOSIS — Z923 Personal history of irradiation: Secondary | ICD-10-CM

## 2014-02-27 DIAGNOSIS — IMO0002 Reserved for concepts with insufficient information to code with codable children: Secondary | ICD-10-CM | POA: Diagnosis present

## 2014-02-27 DIAGNOSIS — R6510 Systemic inflammatory response syndrome (SIRS) of non-infectious origin without acute organ dysfunction: Secondary | ICD-10-CM | POA: Diagnosis not present

## 2014-02-27 DIAGNOSIS — F0390 Unspecified dementia without behavioral disturbance: Secondary | ICD-10-CM | POA: Diagnosis present

## 2014-02-27 DIAGNOSIS — I959 Hypotension, unspecified: Secondary | ICD-10-CM | POA: Diagnosis present

## 2014-02-27 LAB — CBC WITH DIFFERENTIAL/PLATELET
BASOS ABS: 0 10*3/uL (ref 0.0–0.1)
BASOS PCT: 0 % (ref 0–1)
EOS ABS: 0.8 10*3/uL — AB (ref 0.0–0.7)
Eosinophils Relative: 4 % (ref 0–5)
HCT: 46.4 % (ref 39.0–52.0)
Hemoglobin: 13.5 g/dL (ref 13.0–17.0)
LYMPHS ABS: 1.6 10*3/uL (ref 0.7–4.0)
Lymphocytes Relative: 8 % — ABNORMAL LOW (ref 12–46)
MCH: 24.9 pg — AB (ref 26.0–34.0)
MCHC: 29.1 g/dL — AB (ref 30.0–36.0)
MCV: 85.5 fL (ref 78.0–100.0)
MONOS PCT: 4 % (ref 3–12)
Monocytes Absolute: 0.8 10*3/uL (ref 0.1–1.0)
NEUTROS ABS: 17.7 10*3/uL — AB (ref 1.7–7.7)
Neutrophils Relative %: 84 % — ABNORMAL HIGH (ref 43–77)
PLATELETS: 330 10*3/uL (ref 150–400)
RBC: 5.43 MIL/uL (ref 4.22–5.81)
RDW: 15.5 % (ref 11.5–15.5)
WBC: 21 10*3/uL — AB (ref 4.0–10.5)

## 2014-02-27 LAB — URINALYSIS, ROUTINE W REFLEX MICROSCOPIC
Bilirubin Urine: NEGATIVE
Glucose, UA: NEGATIVE mg/dL
HGB URINE DIPSTICK: NEGATIVE
Ketones, ur: NEGATIVE mg/dL
NITRITE: NEGATIVE
Protein, ur: NEGATIVE mg/dL
Specific Gravity, Urine: 1.016 (ref 1.005–1.030)
Urobilinogen, UA: 1 mg/dL (ref 0.0–1.0)
pH: 5 (ref 5.0–8.0)

## 2014-02-27 LAB — URINE MICROSCOPIC-ADD ON

## 2014-02-27 LAB — I-STAT CG4 LACTIC ACID, ED
LACTIC ACID, VENOUS: 2.4 mmol/L — AB (ref 0.5–2.2)
LACTIC ACID, VENOUS: 2.09 mmol/L (ref 0.5–2.2)

## 2014-02-27 LAB — I-STAT TROPONIN, ED: TROPONIN I, POC: 0.04 ng/mL (ref 0.00–0.08)

## 2014-02-27 MED ORDER — ALBUTEROL SULFATE (2.5 MG/3ML) 0.083% IN NEBU
2.5000 mg | INHALATION_SOLUTION | Freq: Four times a day (QID) | RESPIRATORY_TRACT | Status: DC
  Administered 2014-02-28 (×4): 2.5 mg via RESPIRATORY_TRACT
  Filled 2014-02-27 (×4): qty 3

## 2014-02-27 MED ORDER — VANCOMYCIN HCL IN DEXTROSE 1-5 GM/200ML-% IV SOLN
1000.0000 mg | Freq: Once | INTRAVENOUS | Status: AC
  Administered 2014-02-27: 1000 mg via INTRAVENOUS
  Filled 2014-02-27: qty 200

## 2014-02-27 MED ORDER — SODIUM CHLORIDE 0.9 % IV BOLUS (SEPSIS)
1000.0000 mL | Freq: Once | INTRAVENOUS | Status: AC
  Administered 2014-02-27: 1000 mL via INTRAVENOUS

## 2014-02-27 MED ORDER — SODIUM CHLORIDE 0.9 % IV BOLUS (SEPSIS)
500.0000 mL | Freq: Once | INTRAVENOUS | Status: AC
Start: 2014-02-27 — End: 2014-02-27
  Administered 2014-02-27: 500 mL via INTRAVENOUS

## 2014-02-27 MED ORDER — POTASSIUM CL IN DEXTROSE 5% 20 MEQ/L IV SOLN
20.0000 meq | INTRAVENOUS | Status: DC
  Administered 2014-02-28 – 2014-03-01 (×2): 20 meq via INTRAVENOUS
  Filled 2014-02-27 (×4): qty 1000

## 2014-02-27 MED ORDER — DEXTROSE 5 % IV SOLN
2.0000 g | Freq: Once | INTRAVENOUS | Status: AC
  Administered 2014-02-27: 2 g via INTRAVENOUS
  Filled 2014-02-27: qty 2

## 2014-02-27 MED ORDER — LORAZEPAM 2 MG/ML IJ SOLN: 1.0000 mg | Freq: Four times a day (QID) | INTRAMUSCULAR | Status: DC | PRN

## 2014-02-27 MED ORDER — ALBUTEROL SULFATE (2.5 MG/3ML) 0.083% IN NEBU: 2.5000 mg | INHALATION_SOLUTION | RESPIRATORY_TRACT | Status: DC | PRN

## 2014-02-27 NOTE — ED Notes (Signed)
Per EMS: Pt from [facility] assisted living. Called to facility for altered LOC, BP 92/70, 82 bpm. Pt difficult to arouse. Localizes pain, but nonverbal.

## 2014-02-27 NOTE — ED Provider Notes (Signed)
81 year old male with history of dementia comes from [place], planes of hypotension per nursing staff, the patient is unable to give any valuable information secondary to his altered mental status and history of dementia. On exam the patient has a blood pressure of 301 systolic, he is not tachycardic, he is mildly tachypneic, he has scattered rales, he does not appear to be in distress. His mental status is decreased, he does not follow commands, he does moan to pain, he has normal looking lower extremities without edema, soft abdomen which is diffusely tympanitic to percussion and does not appear to be tender, heart sounds without murmurs, no obvious rashes, no obvious decubitus ulcers, genitalia appear normal with uncircumcised penis. At this time will undergo evaluation for source of hypotension, concern for decreased mental status, anticipate admission. \ The pt has severe hypercalcemia, he has ongoing signs of hypotension, he has sepsis and was treated with abx, broad spectrum as well as fluid bolus and admission.  The pt is septic and criticall ill.  Multiple reevaluations, evaluation of labs, consultants, and d/w family.  CRITICAL CARE Performed by: Johnna Acosta Total critical care time: 35 Critical care time was exclusive of separately billable procedures and treating other patients. Critical care was necessary to treat or prevent imminent or life-threatening deterioration. Critical care was time spent personally by me on the following activities: development of treatment plan with patient and/or surrogate as well as nursing, discussions with consultants, evaluation of patient's response to treatment, examination of patient, obtaining history from patient or surrogate, ordering and performing treatments and interventions, ordering and review of laboratory studies, ordering and review of radiographic studies, pulse oximetry and re-evaluation of patient's condition.   Medical  screening examination/treatment/procedure(s) were conducted as a shared visit with non-physician practitioner(s) and myself.  I personally evaluated the patient during the encounter.  Clinical Impression:   Final diagnoses:  Sepsis  Altered mental status  Hypercalcemia  Dehydration  Acute renal failure, unspecified acute renal failure type  Malignant neoplasm of urinary bladder, unspecified site  Altered mental status, unspecified altered mental status type         Johnna Acosta, MD 02/28/14 2343

## 2014-02-27 NOTE — ED Provider Notes (Signed)
CSN: [id number]     Arrival date & time 02/27/14  1840 History   First MD Initiated Contact with Patient 02/27/14 1841     Chief Complaint  Patient presents with  . Altered Mental Status   HPI  Patient is an 81 year old male with a past medical history of bladder cancer being treated with radiation to the pelvis, CHF, pulmonary HTN, chronic aspiration, and urinary retention who presents emergency room via EMS for evaluation of altered consciousness. Patient is currently undergoing palliative treatment for his cancer with hospice care.  He was given approximately several weeks.  Patient presents from [facility] and was noticed to have altered consciousness. His baseline is that he normally will acknowledge people but does not speak very much. Patient was not responding at all to his caregivers. He was noted to have a blood pressure of 92/70 by EMS. Patient does have a past medical history of CHF, pulmonary hypertension, chronic aspiration into the airway, pneumonitis, seizure, and anemia. It's unclear when patient was last seen normal.  Past Medical History  Diagnosis Date  . Anemia   . Urinary retention   . CHF (congestive heart failure)   . Pulmonary hypertension   . Seizure   . Hypernatremia   . Aspiration into airway   . Lung injury   . Pneumonitis   . Fall   . Radiation 12/23/13-01/11/14    left pelvis 30 gray   Past Surgical History  Procedure Laterality Date  . Transurethral resection of bladder tumor N/A 12/18/2013    Procedure: TRANSURETHRAL RESECTION OF BLADDER TUMOR (TURBT);  Surgeon: Festus Aloe, MD;  Location: [facility];  Service: Urology;  Laterality: N/A;   No family history on file. History  Substance Use Topics  . Smoking status: Never Smoker   . Smokeless tobacco: Never Used  . Alcohol Use: No    Review of Systems L5 caveat secondary to altered mental status.   Allergies  Hydrochlorothiazide  Home Medications   Prior to Admission  medications   Medication Sig Start Date End Date Taking? Authorizing Provider  acetaminophen (TYLENOL) 325 MG tablet Take 650 mg by mouth every 6 (six) hours as needed (for pain).   Yes Historical Provider, MD  atorvastatin (LIPITOR) 10 MG tablet Take 10 mg by mouth at bedtime.   Yes Historical Provider, MD  calcium carbonate (OS-CAL - DOSED IN MG OF ELEMENTAL CALCIUM) 1250 MG tablet Take 1 tablet by mouth daily with breakfast.   Yes Historical Provider, MD  cholecalciferol (VITAMIN D) 400 UNITS TABS tablet Take 800 Units by mouth daily with breakfast.   Yes Historical Provider, MD  donepezil (ARICEPT) 5 MG tablet Take 5 mg by mouth at bedtime.   Yes Historical Provider, MD  ENSURE (ENSURE) Take 237 mLs by mouth 3 (three) times daily. Patient drinks Vanilla flavor   Yes Historical Provider, MD  finasteride (PROSCAR) 5 MG tablet Take 5 mg by mouth daily with breakfast.   Yes Historical Provider, MD  folic acid (FOLVITE) 1 MG tablet Take 1 tablet (1 mg total) by mouth daily. 12/24/13  Yes Eugenie Filler, MD  Foot Care Products [facility] BUNION CUSHIONS) PADS Apply 1 applicator topically 3 (three) times a week.   Yes Historical Provider, MD  furosemide (LASIX) 40 MG tablet Take 40 mg by mouth daily with breakfast.   Yes Historical Provider, MD  hydrALAZINE (APRESOLINE) 25 MG tablet Take 25 mg by mouth 3 (three) times daily.   Yes Historical Provider, MD  levothyroxine (SYNTHROID, LEVOTHROID) 88 MCG tablet Take 88 mcg by mouth daily before breakfast.   Yes Historical Provider, MD  mineral oil liquid Place 5 mLs into the right ear once a week. On Tuesday's   Yes Historical Provider, MD  Multiple Vitamin (MULTIVITAMIN WITH MINERALS) TABS tablet Take 1 tablet by mouth daily. 12/24/13  Yes Eugenie Filler, MD  naproxen sodium (ANAPROX) 220 MG tablet Take 220 mg by mouth 2 (two) times daily with a meal.   Yes Historical Provider, MD  nystatin (MYCOSTATIN/NYSTOP) 100000 UNIT/GM POWD Apply 1 g topically  daily.   Yes Historical Provider, MD  polyethylene glycol (MIRALAX / GLYCOLAX) packet Take 17 g by mouth daily.   Yes Historical Provider, MD  psyllium (REGULOID) 0.52 G capsule Take 0.52 g by mouth at bedtime.   Yes Historical Provider, MD  tamsulosin (FLOMAX) 0.4 MG CAPS capsule Take 0.4 mg by mouth daily with breakfast.   Yes Historical Provider, MD  thiamine 100 MG tablet Take 1 tablet (100 mg total) by mouth daily. 12/24/13  Yes Eugenie Filler, MD  hyoscyamine (LEVSIN SL) 0.125 MG SL tablet Place 2 tablets (0.25 mg total) under the tongue every 4 (four) hours as needed (Bladder spasms/irritation). Patient not taking: Reported on 02/27/2014 12/24/13   Eugenie Filler, MD  oxyCODONE (OXY IR/ROXICODONE) 5 MG immediate release tablet Take 1 tablet (5 mg total) by mouth every 4 (four) hours as needed for moderate pain. Patient not taking: Reported on 02/27/2014 12/24/13   Eugenie Filler, MD  phenazopyridine (PYRIDIUM) 100 MG tablet Take 1 tablet (100 mg total) by mouth 3 (three) times daily with meals as needed (dysuria). Patient not taking: Reported on 02/27/2014 12/24/13   Eugenie Filler, MD   BP 120/45 mmHg  Pulse 65  Temp(Src) 99.3 F (37.4 C) (Rectal)  Resp 11  Wt 134 lb (60.782 kg)  SpO2 94% Physical Exam  Constitutional: He appears well-developed and well-nourished. No distress.  HENT:  Head: Normocephalic and atraumatic.  Mouth/Throat: Oropharynx is clear and moist. No oropharyngeal exudate.  Eyes: Conjunctivae and EOM are normal. Pupils are equal, round, and reactive to light. No scleral icterus.  Neck: Normal range of motion. Neck supple. No JVD present. No thyromegaly present.  Cardiovascular: Normal rate, regular rhythm, normal heart sounds and intact distal pulses.  Exam reveals no gallop and no friction rub.   No murmur heard. Pulmonary/Chest: Effort normal. No respiratory distress. He has no wheezes. He has rales in the right lower field. He exhibits tenderness.   Abdominal: Soft. Bowel sounds are normal. He exhibits no distension and no mass. There is tenderness. There is no rebound and no guarding.  Lymphadenopathy:    He has no cervical adenopathy.  Neurological:  Patient will not respond to verbal commands. He opens eyes spontaneously. He does withdraw from pain and seems to localize it. Patient does not give any verbal responses. GCS of 10.  Skin: Skin is warm and dry. He is not diaphoretic.  Nursing note and vitals reviewed.   ED Course  Procedures (including critical care time) Labs Review Labs Reviewed  CBC WITH DIFFERENTIAL - Abnormal; Notable for the following:    WBC 21.0 (*)    MCH 24.9 (*)    MCHC 29.1 (*)    Neutrophils Relative % 84 (*)    Neutro Abs 17.7 (*)    Lymphocytes Relative 8 (*)    Eosinophils Absolute 0.8 (*)    All other components within normal limits  COMPREHENSIVE METABOLIC PANEL - Abnormal; Notable for the following:    Sodium 155 (*)    Potassium 3.2 (*)    Glucose, Bld 130 (*)    BUN 92 (*)    Creatinine, Ser 1.79 (*)    Calcium >15.0 (*)    Albumin 2.7 (*)    AST 54 (*)    ALT 81 (*)    Alkaline Phosphatase 160 (*)    GFR calc non Af Amer 33 (*)    GFR calc Af Amer 38 (*)    All other components within normal limits  URINALYSIS, ROUTINE W REFLEX MICROSCOPIC - Abnormal; Notable for the following:    Color, Urine AMBER (*)    APPearance CLOUDY (*)    Leukocytes, UA SMALL (*)    All other components within normal limits  URINE MICROSCOPIC-ADD ON - Abnormal; Notable for the following:    Squamous Epithelial / LPF FEW (*)    Bacteria, UA FEW (*)    Casts HYALINE CASTS (*)    All other components within normal limits  I-STAT CG4 LACTIC ACID, ED - Abnormal; Notable for the following:    Lactic Acid, Venous 2.40 (*)    All other components within normal limits  CULTURE, BLOOD (ROUTINE X 2)  CULTURE, BLOOD (ROUTINE X 2)  URINE CULTURE  I-STAT TROPOININ, ED  I-STAT CG4 LACTIC ACID, ED    Imaging  Review Ct Abdomen Pelvis Wo Contrast  02/27/2014   CLINICAL DATA:  Sepsis. Possible loss of consciousness. Difficult to arouse. 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  EXAM: CT ABDOMEN AND PELVIS WITHOUT CONTRAST  TECHNIQUE: Multidetector CT imaging of the abdomen and pelvis was performed following the standard protocol without IV contrast.  COMPARISON:  CT abdomen 11 11/08/2013  FINDINGS: Lower chest: Linear nodular thickening at the lung bases not changed. Mild bronchiectasis.  Hepatobiliary: No focal hepatic lesion on this noncontrast exam. Gallbladder is normal.  Pancreas: Pancreas is normal. No ductal dilatation. No pancreatic inflammation.  Spleen: Normal spleen.  Adrenals/urinary tract: Adrenal glands and kidneys are normal. The ureters are normal. There is some calcification along the lobe mental surface of the bladder consists with prior surgery.  Stomach/Bowel: Stomach is not images above the diaphragms. Small bowel is nonobstructed. Moderate volume stool throughout the colon. No obstructing lesion identified. Large volume stool rectum. Rectum is distended to 7 cm by stool ball.  Vascular/Lymphatic: Abdominal risk calcified. No retroperitoneal periportal lymphadenopathy.  Reproductive: Prostate gland is normal.  Musculoskeletal: There is a large lytic expansile lesion within the left iliac wing measuring 9.8 x 4.7 cm which is increased from 4.8 x 2.9 cm on CT of 12/17/2013. Likewise the lytic lesions in the T11 vertebral body is increased image 10, series 301. Lesion measures 2.9 x 2.4 cm new from prior.  Other: No evidence of abscess or free fluid in the abdomen pelvis.  IMPRESSION: 1. Interval significant increase in volume of expansile lytic metastasis within the left iliac wing and T11 vertebral body. 2. Postsurgical change in the bladder without complication. 3. No evidence of abscess or inflammation. 4. Large hiatal hernia. 5. Large stool ball with the rectum and large volume stool  throughout the colon suggests constipation. Consider distal impaction / enema.   Electronically Signed   By: Suzy Bouchard M.D.   On: 02/27/2014 21:21   Dg Chest Port 1 View  02/27/2014   CLINICAL DATA:  Altered mental status, congestive heart failure  EXAM: PORTABLE CHEST - 1 VIEW  COMPARISON:  Chest radiograph 12/17/2013  FINDINGS: Normal cardiac silhouette. Lungs are hyperinflated. There is apical scarring not changed from prior. The patient's chin does obscure the right lung apex. No infiltrate or pneumothorax noted. Chronic rib deformities on the left.  IMPRESSION: Hyperinflated lungs without acute findings.   Electronically Signed   By: Suzy Bouchard M.D.   On: 02/27/2014 19:48     EKG Interpretation None      MDM   Final diagnoses:  Sepsis  Altered mental status  Hypercalcemia  Dehydration  Acute renal failure, unspecified acute renal failure type  Malignant neoplasm of urinary bladder, unspecified site  Altered mental status, unspecified altered mental status type   Patient is an 81 year old male with past medical history of bladder cancer that is likely terminal presents emergency room for evaluation of altered mental status. Physical exam reveals patient with a GCS of 10 and soft pressures. He does seem to localize to pain. Unclear how long he has been like this. Chest x-ray reveals no acute findings with the exception of hyperinflation of the lungs. CT of the abdomen and pelvis reveals constipation, increasing metastatic disease in the left iliac wing and in the lower back at T11, and a large hiatal hernia. Urinalysis reveals no frank infection. There is no visible urinary obstruction. I-STAT lactic acid was initially elevated at 2.4. After IV hydration lactic acid has normalized. I-STAT troponin is negative. CBC reveals a leukocytosis of 21,000 with neutropenia. CMP does reveal hypercalcemia, hypernatremia, elevated liver enzymes, and worsening renal function. Patient has been  given 1.5 L normal saline bolus with stable blood pressures. I have spoken with Mr. Rakes healthcare power of attorney his brother who initially expressed that he would like a full code with extraordinary measures including CPR, and intubation. Patient was again spoken to by Dr. Sabra Heck and a no CPR and no intubation was established. I spoke with Dr. Arnoldo Morale who will come to see the patient here in the emergency room. He initially she asked for head CT, but she has canceled that order. Patient to be admitted at this time. A full no code has been established.   Cherylann Parr, PA-C 02/27/14 2318  Johnna Acosta, MD 02/28/14 [phone]

## 2014-02-27 NOTE — H&P (Signed)
[facility] Admission History and Physical       William Baker KGS:[id number] DOB: [birth year] DOA: 02/27/2014  Referring physician: EDP PCP: [name], MD  Specialists:   Chief Complaint: Decreased Responsiveness  HPI: William Baker is a 81 y.o. male resident of the [facility] with a history of Stage IV Malignancy of the Bladder S/P Radiation Rx no longer on Treatment who was sent to the ED due to increased lethargy and decreased responsiveness over the past 24 hours per family.   He was evaluated and found to have multiple laboratory abnormalities including Hypernatremia, Hypercalcemia and ARF along with hypotension.  The EDP spoke with the [place] who reports being told by the patient's doctors that the patient had less than 2 weeks to live, and they wish for the patient to be a Do Not Resuscitate and be made comfortable and to consult palliative care services.       Review of Systems: Unable to Obtain from the Patient    Past Medical History  Diagnosis Date  . Anemia   . Urinary retention   . CHF (congestive heart failure)   . Pulmonary hypertension   . Seizure   . Hypernatremia   . Aspiration into airway   . Lung injury   . Pneumonitis   . Fall   . Radiation 12/23/13-01/11/14    left pelvis 30 gray      Past Surgical History  Procedure Laterality Date  . Transurethral resection of bladder tumor N/A 12/18/2013    Procedure: TRANSURETHRAL RESECTION OF BLADDER TUMOR (TURBT);  Surgeon: Festus Aloe, MD;  Location: [facility];  Service: Urology;  Laterality: N/A;       Prior to Admission medications   Medication Sig Start Date End Date Taking? Authorizing Provider  acetaminophen (TYLENOL) 325 MG tablet Take 650 mg by mouth every 6 (six) hours as needed (for pain).   Yes Historical Provider, MD  atorvastatin (LIPITOR) 10 MG tablet Take 10 mg by mouth at bedtime.   Yes Historical Provider, MD  calcium carbonate (OS-CAL - DOSED IN MG OF ELEMENTAL  CALCIUM) 1250 MG tablet Take 1 tablet by mouth daily with breakfast.   Yes Historical Provider, MD  cholecalciferol (VITAMIN D) 400 UNITS TABS tablet Take 800 Units by mouth daily with breakfast.   Yes Historical Provider, MD  donepezil (ARICEPT) 5 MG tablet Take 5 mg by mouth at bedtime.   Yes Historical Provider, MD  ENSURE (ENSURE) Take 237 mLs by mouth 3 (three) times daily. Patient drinks Vanilla flavor   Yes Historical Provider, MD  finasteride (PROSCAR) 5 MG tablet Take 5 mg by mouth daily with breakfast.   Yes Historical Provider, MD  folic acid (FOLVITE) 1 MG tablet Take 1 tablet (1 mg total) by mouth daily. 12/24/13  Yes Eugenie Filler, MD  Foot Care Products [facility] BUNION CUSHIONS) PADS Apply 1 applicator topically 3 (three) times a week.   Yes Historical Provider, MD  furosemide (LASIX) 40 MG tablet Take 40 mg by mouth daily with breakfast.   Yes Historical Provider, MD  hydrALAZINE (APRESOLINE) 25 MG tablet Take 25 mg by mouth 3 (three) times daily.   Yes Historical Provider, MD  levothyroxine (SYNTHROID, LEVOTHROID) 88 MCG tablet Take 88 mcg by mouth daily before breakfast.   Yes Historical Provider, MD  mineral oil liquid Place 5 mLs into the right ear once a week. On Tuesday's   Yes Historical Provider, MD  Multiple Vitamin (MULTIVITAMIN WITH MINERALS) TABS tablet  Take 1 tablet by mouth daily. 12/24/13  Yes Eugenie Filler, MD  naproxen sodium (ANAPROX) 220 MG tablet Take 220 mg by mouth 2 (two) times daily with a meal.   Yes Historical Provider, MD  nystatin (MYCOSTATIN/NYSTOP) 100000 UNIT/GM POWD Apply 1 g topically daily.   Yes Historical Provider, MD  polyethylene glycol (MIRALAX / GLYCOLAX) packet Take 17 g by mouth daily.   Yes Historical Provider, MD  psyllium (REGULOID) 0.52 G capsule Take 0.52 g by mouth at bedtime.   Yes Historical Provider, MD  tamsulosin (FLOMAX) 0.4 MG CAPS capsule Take 0.4 mg by mouth daily with breakfast.   Yes Historical Provider, MD  thiamine  100 MG tablet Take 1 tablet (100 mg total) by mouth daily. 12/24/13  Yes Eugenie Filler, MD  hyoscyamine (LEVSIN SL) 0.125 MG SL tablet Place 2 tablets (0.25 mg total) under the tongue every 4 (four) hours as needed (Bladder spasms/irritation). Patient not taking: Reported on 02/27/2014 12/24/13   Eugenie Filler, MD  oxyCODONE (OXY IR/ROXICODONE) 5 MG immediate release tablet Take 1 tablet (5 mg total) by mouth every 4 (four) hours as needed for moderate pain. Patient not taking: Reported on 02/27/2014 12/24/13   Eugenie Filler, MD  phenazopyridine (PYRIDIUM) 100 MG tablet Take 1 tablet (100 mg total) by mouth 3 (three) times daily with meals as needed (dysuria). Patient not taking: Reported on 02/27/2014 12/24/13   Eugenie Filler, MD      Allergies  Allergen Reactions  . Hydrochlorothiazide Other (See Comments)    Hyponatremia     Social History:  reports that he has never smoked. He has never used smokeless tobacco. He reports that he does not drink alcohol or use illicit drugs.     No family history on file.     Physical Exam:  GEN:  Elderly Cachectic Frail and Ill Appearing 81 y.o. Caucasian male examined and in no acute distress;  Filed Vitals:   02/27/14 2215 02/27/14 2230 02/27/14 2245 02/27/14 2300  BP: 112/52 122/52 108/52 120/45  Pulse: 70 67 64 65  Temp:      TempSrc:      Resp: 16 12 13 11   Weight:      SpO2: 95% 96% 99% 94%   Blood pressure 120/45, pulse 65, temperature 99.3 F (37.4 C), temperature source Rectal, resp. rate 11, weight 60.782 kg (134 lb), SpO2 94 %. PSYCH: He is alert and oriented x 1; Obtunded  HEENT: Normocephalic and Atraumatic, Mucous membranes pink; PERRLA; EOM intact; Fundi:  Benign;  No scleral icterus, Nares: Patent, Oropharynx: Clear,  Poor Dentition,    Neck:  Flexed and Kyphotic,   No Cervical Lymphadenopathy nor Thyromegaly or Carotid Bruit; No JVD; Breasts:: Not examined CHEST WALL: No tenderness CHEST: Normal respiration, clear  to auscultation bilaterally HEART: Regular rate and rhythm; no murmurs rubs or gallops BACK: No kyphosis or scoliosis; No CVA tenderness ABDOMEN: Positive Bowel Sounds, Scaphoid, Soft Non-Tender; No Masses, No Organomegaly. Rectal Exam: Not done EXTREMITIES: + Mild contractures of BLEs :  + Atrophy, No Edema, No Cyanosis, Clubbing, No Ulcerations. Genitalia: not examined PULSES: 2+ and symmetric SKIN: Decreased Hydration,  No rash or ulceration CNS:  Obtunded, Oriented x 1,  Generalized Weakness, Unable to Reposition himself  Vascular: pulses palpable throughout    Labs on Admission:  Basic Metabolic Panel:  Recent Labs Lab 02/27/14 1909  NA 155*  K 3.2*  CL 110  CO2 32  GLUCOSE 130*  BUN 92*  CREATININE 1.79*  CALCIUM >15.0*   Liver Function Tests:  Recent Labs Lab 02/27/14 1909  AST 54*  ALT 81*  ALKPHOS 160*  BILITOT 1.1  PROT 7.9  ALBUMIN 2.7*   No results for input(s): LIPASE, AMYLASE in the last 168 hours. No results for input(s): AMMONIA in the last 168 hours. CBC:  Recent Labs Lab 02/27/14 1909  WBC 21.0*  NEUTROABS 17.7*  HGB 13.5  HCT 46.4  MCV 85.5  PLT 330   Cardiac Enzymes: No results for input(s): CKTOTAL, CKMB, CKMBINDEX, TROPONINI in the last 168 hours.  BNP (last 3 results) No results for input(s): PROBNP in the last 8760 hours. CBG: No results for input(s): GLUCAP in the last 168 hours.  Radiological Exams on Admission: Ct Abdomen Pelvis Wo Contrast  02/27/2014   CLINICAL DATA:  Sepsis. Possible loss of consciousness. Difficult to arouse. 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  EXAM: CT ABDOMEN AND PELVIS WITHOUT CONTRAST  TECHNIQUE: Multidetector CT imaging of the abdomen and pelvis was performed following the standard protocol without IV contrast.  COMPARISON:  CT abdomen 11 11/08/2013  FINDINGS: Lower chest: Linear nodular thickening at the lung bases not changed. Mild bronchiectasis.  Hepatobiliary: No focal hepatic  lesion on this noncontrast exam. Gallbladder is normal.  Pancreas: Pancreas is normal. No ductal dilatation. No pancreatic inflammation.  Spleen: Normal spleen.  Adrenals/urinary tract: Adrenal glands and kidneys are normal. The ureters are normal. There is some calcification along the lobe mental surface of the bladder consists with prior surgery.  Stomach/Bowel: Stomach is not images above the diaphragms. Small bowel is nonobstructed. Moderate volume stool throughout the colon. No obstructing lesion identified. Large volume stool rectum. Rectum is distended to 7 cm by stool ball.  Vascular/Lymphatic: Abdominal risk calcified. No retroperitoneal periportal lymphadenopathy.  Reproductive: Prostate gland is normal.  Musculoskeletal: There is a large lytic expansile lesion within the left iliac wing measuring 9.8 x 4.7 cm which is increased from 4.8 x 2.9 cm on CT of 12/17/2013. Likewise the lytic lesions in the T11 vertebral body is increased image 10, series 301. Lesion measures 2.9 x 2.4 cm new from prior.  Other: No evidence of abscess or free fluid in the abdomen pelvis.  IMPRESSION: 1. Interval significant increase in volume of expansile lytic metastasis within the left iliac wing and T11 vertebral body. 2. Postsurgical change in the bladder without complication. 3. No evidence of abscess or inflammation. 4. Large hiatal hernia. 5. Large stool ball with the rectum and large volume stool throughout the colon suggests constipation. Consider distal impaction / enema.   Electronically Signed   By: Suzy Bouchard M.D.   On: 02/27/2014 21:21   Dg Chest Port 1 View  02/27/2014   CLINICAL DATA:  Altered mental status, congestive heart failure  EXAM: PORTABLE CHEST - 1 VIEW  COMPARISON:  Chest radiograph 12/17/2013  FINDINGS: Normal cardiac silhouette. Lungs are hyperinflated. There is apical scarring not changed from prior. The patient's chin does obscure the right lung apex. No infiltrate or pneumothorax noted.  Chronic rib deformities on the left.  IMPRESSION: Hyperinflated lungs without acute findings.   Electronically Signed   By: Suzy Bouchard M.D.   On: 02/27/2014 19:48      Assessment/Plan:   81 y.o. male with  Active Problems:   1.   SIRS (systemic inflammatory response syndrome)-  No Specific Source Identified, Has Leukocytosis with Left Shift, and Lactic Acidosis, and hypotension with ARF   Blood and Urine  Cultures Sent   IV Vancomycin and Cefepime initiated        2.   Multisystem organ failure- Prognosis Poor,  Family Told < 2 week Survival      3.   Metabolic encephalopathy- Due to #1, and #2     4.   ARF (acute renal failure)- Due to #2     5.   Hypotension- Due to #1 and  #2       6.   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   Hypernatremia- due to Dehydration   IVFs  D5W     8.    Hypokalemia- Mild   IVFs with KCl    9.   Malignant neoplasm of urinary bladder- Hx of Radiation Rx   Terminal      10.   Malnutrition of moderate degree   Ensure as Tolerated TID with meals   If Alert Heart Healthy Diet    11.  Urinary retention- due to BPH, and Hx Rad Rx on Tamsulosin, and Finasteride   Foley Catheter Placed    12.     DVT Prophylaxis   Heparin SQ    13.    Other -Palliative Care Consultation in AM      Code Status:   Do Not Resuscitate (DNR)    Family Communication:    No Family at Bedside Disposition Plan:     Inpatient    Time spent:  [facility] Pager [phone]   If [place] Please Contact the Day Rounding Team MD for [facility]  If 7PM-7AM, Please Contact Night-Floor Coverage  [email or web address] Password [email or web address] 02/27/2014, 11:22 PM

## 2014-02-27 NOTE — ED Notes (Addendum)
Contact info: Hersel Mcmeen [phone] [facility])  [phone] (Mobile)

## 2014-02-27 NOTE — Progress Notes (Signed)
ANTIBIOTIC CONSULT NOTE - INITIAL  Pharmacy Consult for Vancomycin & Cefepime Indication:  R/O Aspiration PNA  Allergies  Allergen Reactions  . Hydrochlorothiazide Other (See Comments)    Hyponatremia   Patient Measurements:  Adjusted Body Weight:   Vital Signs: Temp: 97.7 F (36.5 C) (01/09 1849) Temp Source: Axillary (01/09 1849) BP: 115/55 mmHg (01/09 1849) Pulse Rate: 79 (01/09 1849)  Labs: No results for input(s): WBC, HGB, PLT, LABCREA, CREATININE in the last 72 hours.  Microbiology: No results found for this or any previous visit (from the past 720 hour(s)).  Medical History: Past Medical History  Diagnosis Date  . Anemia   . Urinary retention   . CHF (congestive heart failure)   . Pulmonary hypertension   . Seizure   . Hypernatremia   . Aspiration into airway   . Lung injury   . Pneumonitis   . Fall   . Radiation 12/23/13-01/11/14    left pelvis 30 gray   Medications:  Anti-infectives    Start     Dose/Rate Route Frequency Ordered Stop   02/27/14 1900  ceFEPIme (MAXIPIME) 2 g in dextrose 5 % 50 mL IVPB     2 g100 mL/hr over 30 Minutes Intravenous  Once 02/27/14 1851     02/27/14 1900  vancomycin (VANCOCIN) IVPB 1000 mg/200 mL premix     1,000 mg200 mL/hr over 60 Minutes Intravenous  Once 02/27/14 1851       Assessment: 81yo M admitted with altered consciousness.  He has chronic problems with his airway and there is concern for possible aspiration.  We have been asked to start him on IV Vancomycin and Cefepime.  He has had one dose of 2gm Cefepime and one dose of 1gm Vancomycin ordered.  His creatinine is normal at 0.78 and he has an estimated crcl of ~ 60 ml/min.    Goal of Therapy:  Vancomycin trough level 15-20 mcg/ml  Plan:  - Vancomycin 1gm IV x 1 then 750 mg IV every 12 hours - Cefepime 2gm IV x1 then 1gm IV every 12 hours - Monitor for clinical response and ongoing antibiotic needs - Check s/s levels as appropriate  Rober Minion, PharmD.,  MS Clinical Pharmacist Pager:  [phone] Thank you for allowing pharmacy to be part of this patients care team. 02/27/2014,7:02 PM

## 2014-02-27 NOTE — ED Notes (Signed)
MD spoke to brother, Samer Dutton POA over phone and confirmed DNR status.

## 2014-02-28 DIAGNOSIS — C679 Malignant neoplasm of bladder, unspecified: Secondary | ICD-10-CM | POA: Insufficient documentation

## 2014-02-28 DIAGNOSIS — R410 Disorientation, unspecified: Secondary | ICD-10-CM | POA: Insufficient documentation

## 2014-02-28 DIAGNOSIS — I959 Hypotension, unspecified: Secondary | ICD-10-CM

## 2014-02-28 DIAGNOSIS — N179 Acute kidney failure, unspecified: Secondary | ICD-10-CM | POA: Insufficient documentation

## 2014-02-28 DIAGNOSIS — A419 Sepsis, unspecified organism: Secondary | ICD-10-CM | POA: Insufficient documentation

## 2014-02-28 DIAGNOSIS — E860 Dehydration: Secondary | ICD-10-CM | POA: Insufficient documentation

## 2014-02-28 DIAGNOSIS — Z515 Encounter for palliative care: Secondary | ICD-10-CM | POA: Insufficient documentation

## 2014-02-28 DIAGNOSIS — E870 Hyperosmolality and hypernatremia: Secondary | ICD-10-CM

## 2014-02-28 DIAGNOSIS — R4182 Altered mental status, unspecified: Secondary | ICD-10-CM | POA: Insufficient documentation

## 2014-02-28 LAB — COMPREHENSIVE METABOLIC PANEL
ALK PHOS: 160 U/L — AB (ref 39–117)
ALT: 81 U/L — ABNORMAL HIGH (ref 0–53)
AST: 54 U/L — AB (ref 0–37)
Albumin: 2.7 g/dL — ABNORMAL LOW (ref 3.5–5.2)
Anion gap: 13 (ref 5–15)
BILIRUBIN TOTAL: 1.1 mg/dL (ref 0.3–1.2)
BUN: 92 mg/dL — AB (ref 6–23)
CO2: 32 mmol/L (ref 19–32)
CREATININE: 1.79 mg/dL — AB (ref 0.50–1.35)
Calcium: >15 mg/dL (ref 8.4–10.5)
Chloride: 110 mEq/L (ref 96–112)
GFR calc Af Amer: 38 mL/min — ABNORMAL LOW (ref >90)
GFR, EST NON AFRICAN AMERICAN: 33 mL/min — AB (ref >90)
GLUCOSE: 130 mg/dL — AB (ref 70–99)
Potassium: 3.2 mmol/L — ABNORMAL LOW (ref 3.5–5.1)
SODIUM: 155 mmol/L — AB (ref 135–145)
TOTAL PROTEIN: 7.9 g/dL (ref 6.0–8.3)

## 2014-02-28 LAB — BASIC METABOLIC PANEL
Anion gap: 7 (ref 5–15)
BUN: 88 mg/dL — ABNORMAL HIGH (ref 6–23)
CHLORIDE: 117 meq/L — AB (ref 96–112)
CO2: 35 mmol/L — ABNORMAL HIGH (ref 19–32)
CREATININE: 1.38 mg/dL — AB (ref 0.50–1.35)
Calcium: 13.6 mg/dL (ref 8.4–10.5)
GFR calc Af Amer: 52 mL/min — ABNORMAL LOW (ref >90)
GFR, EST NON AFRICAN AMERICAN: 45 mL/min — AB (ref >90)
GLUCOSE: 133 mg/dL — AB (ref 70–99)
Potassium: 2.9 mmol/L — ABNORMAL LOW (ref 3.5–5.1)
SODIUM: 159 mmol/L — AB (ref 135–145)

## 2014-02-28 LAB — MRSA PCR SCREENING: MRSA BY PCR: NEGATIVE

## 2014-02-28 LAB — CBC
HEMATOCRIT: 35.6 % — AB (ref 39.0–52.0)
Hemoglobin: 10.4 g/dL — ABNORMAL LOW (ref 13.0–17.0)
MCH: 25.4 pg — AB (ref 26.0–34.0)
MCHC: 29.2 g/dL — AB (ref 30.0–36.0)
MCV: 87 fL (ref 78.0–100.0)
Platelets: 301 10*3/uL (ref 150–400)
RBC: 4.09 MIL/uL — ABNORMAL LOW (ref 4.22–5.81)
RDW: 15.4 % (ref 11.5–15.5)
WBC: 20.5 10*3/uL — ABNORMAL HIGH (ref 4.0–10.5)

## 2014-02-28 MED ORDER — ONDANSETRON HCL 4 MG PO TABS: 4.0000 mg | ORAL_TABLET | Freq: Four times a day (QID) | ORAL | Status: DC | PRN

## 2014-02-28 MED ORDER — ENSURE COMPLETE PO LIQD
237.0000 mL | Freq: Three times a day (TID) | ORAL | Status: DC
  Administered 2014-02-28 – 2014-03-02 (×4): 237 mL via ORAL

## 2014-02-28 MED ORDER — HEPARIN SODIUM (PORCINE) 5000 UNIT/ML IJ SOLN
5000.0000 [IU] | Freq: Three times a day (TID) | INTRAMUSCULAR | Status: DC
  Administered 2014-02-28: 5000 [IU] via SUBCUTANEOUS
  Filled 2014-02-28 (×3): qty 1

## 2014-02-28 MED ORDER — ALBUTEROL SULFATE (2.5 MG/3ML) 0.083% IN NEBU: 2.5000 mg | INHALATION_SOLUTION | Freq: Four times a day (QID) | RESPIRATORY_TRACT | Status: DC | PRN

## 2014-02-28 MED ORDER — CHLORHEXIDINE GLUCONATE 0.12 % MT SOLN
15.0000 mL | Freq: Two times a day (BID) | OROMUCOSAL | Status: DC
  Administered 2014-02-28 – 2014-03-02 (×5): 15 mL via OROMUCOSAL
  Filled 2014-02-28 (×5): qty 15

## 2014-02-28 MED ORDER — VITAMIN B-1 100 MG PO TABS
100.0000 mg | ORAL_TABLET | Freq: Every day | ORAL | Status: DC
  Administered 2014-02-28: 100 mg via ORAL
  Filled 2014-02-28: qty 1

## 2014-02-28 MED ORDER — FUROSEMIDE 40 MG PO TABS
40.0000 mg | ORAL_TABLET | Freq: Every day | ORAL | Status: DC
  Administered 2014-02-28: 40 mg via ORAL
  Filled 2014-02-28 (×2): qty 1

## 2014-02-28 MED ORDER — ADULT MULTIVITAMIN W/MINERALS CH
1.0000 | ORAL_TABLET | Freq: Every day | ORAL | Status: DC
  Administered 2014-02-28: 1 via ORAL
  Filled 2014-02-28: qty 1

## 2014-02-28 MED ORDER — HYDROMORPHONE HCL 1 MG/ML IJ SOLN: 0.5000 mg | INTRAMUSCULAR | Status: DC | PRN

## 2014-02-28 MED ORDER — NYSTATIN 100000 UNIT/GM EX POWD
1.0000 g | Freq: Every day | CUTANEOUS | Status: DC
  Administered 2014-02-28 – 2014-03-02 (×3): 1 g via TOPICAL
  Filled 2014-02-28: qty 15

## 2014-02-28 MED ORDER — DONEPEZIL HCL 5 MG PO TABS
5.0000 mg | ORAL_TABLET | Freq: Every day | ORAL | Status: DC
  Administered 2014-02-28: 5 mg via ORAL
  Filled 2014-02-28 (×3): qty 1

## 2014-02-28 MED ORDER — LACTULOSE 10 GM/15ML PO SOLN
30.0000 g | Freq: Two times a day (BID) | ORAL | Status: DC
  Administered 2014-02-28 – 2014-03-02 (×5): 30 g via ORAL
  Filled 2014-02-28 (×7): qty 45

## 2014-02-28 MED ORDER — ACETAMINOPHEN 650 MG RE SUPP
650.0000 mg | Freq: Four times a day (QID) | RECTAL | Status: DC | PRN
  Filled 2014-02-28: qty 1

## 2014-02-28 MED ORDER — ACETAMINOPHEN 325 MG PO TABS
650.0000 mg | ORAL_TABLET | Freq: Four times a day (QID) | ORAL | Status: DC | PRN
  Administered 2014-02-28: 650 mg via ORAL
  Filled 2014-02-28: qty 2

## 2014-02-28 MED ORDER — POTASSIUM CHLORIDE 10 MEQ/100ML IV SOLN
10.0000 meq | INTRAVENOUS | Status: AC
  Administered 2014-02-28 (×3): 10 meq via INTRAVENOUS
  Filled 2014-02-28 (×2): qty 100

## 2014-02-28 MED ORDER — SODIUM CHLORIDE 0.9 % IV SOLN: INTRAVENOUS | Status: DC

## 2014-02-28 MED ORDER — ONDANSETRON HCL 4 MG/2ML IJ SOLN: 4.0000 mg | Freq: Four times a day (QID) | INTRAMUSCULAR | Status: DC | PRN

## 2014-02-28 MED ORDER — LEVOTHYROXINE SODIUM 88 MCG PO TABS
88.0000 ug | ORAL_TABLET | Freq: Every day | ORAL | Status: DC
  Administered 2014-02-28 – 2014-03-02 (×3): 88 ug via ORAL
  Filled 2014-02-28 (×4): qty 1

## 2014-02-28 MED ORDER — FINASTERIDE 5 MG PO TABS
5.0000 mg | ORAL_TABLET | Freq: Every day | ORAL | Status: DC
  Administered 2014-02-28: 5 mg via ORAL
  Filled 2014-02-28 (×2): qty 1

## 2014-02-28 MED ORDER — SENNOSIDES 8.8 MG/5ML PO SYRP
5.0000 mL | ORAL_SOLUTION | Freq: Every day | ORAL | Status: DC
  Administered 2014-02-28 – 2014-03-01 (×2): 5 mL via ORAL
  Filled 2014-02-28 (×3): qty 5

## 2014-02-28 MED ORDER — FOLIC ACID 1 MG PO TABS
1.0000 mg | ORAL_TABLET | Freq: Every day | ORAL | Status: DC
Start: 2014-02-28 — End: 2014-02-28
  Administered 2014-02-28: 1 mg via ORAL
  Filled 2014-02-28: qty 1

## 2014-02-28 MED ORDER — OXYCODONE HCL 5 MG PO TABS: 5.0000 mg | ORAL_TABLET | ORAL | Status: DC | PRN

## 2014-02-28 MED ORDER — CETYLPYRIDINIUM CHLORIDE 0.05 % MT LIQD
7.0000 mL | Freq: Two times a day (BID) | OROMUCOSAL | Status: DC
  Administered 2014-02-28 – 2014-03-01 (×4): 7 mL via OROMUCOSAL

## 2014-02-28 NOTE — Consult Note (Signed)
Patient William Baker      DOB: [birth year]      JQB:[id number]     Consult Note from the Palliative Medicine Team at [facility] Requested by: Dr Arnoldo Morale     PCP: [name], MD Reason for Consultation:EOL care    Phone Number:[phone]  Assessment/Recommendations:  81 yo male with metastatic bladder CA who presented with AMS, MSOF, malignant hypercalcemia on dying trajectory from bladder CA.    1.  Code Status: DNR  2. Goals of Care: Comfort/Hospice Care.  Some struggle by family today about this.  I discussed at length with family along with hospice team and strongly recommended only doing things to Mr Kinzler that would ensure comfort and being aggressive in doing so.  They agreed with this. I will simplify med list to reflect comfort care.  Will meet again with brother Wille Glaser to decide about disposition tomorrow. Either to ALF with hospice care or [facility]. I suspect we will be talking [facility].  I have d/c'd abx, lasix, etc   3. Symptom Management:   1. Pain: Agree with PRN dilaudid given his AKI.   2. Constipation: Noted on CT. I will avoid manual disimpaction or enema for comfort. Can try oral laxatives.   3. Delirium: 2/2 malignant hypercalcemia. PRN ativan ordered.    4. Psychosocial/Spiritual: [facility] patient living at ALF. Had several friends there. HCPOA Wille Glaser is only family. Mr Zangara was never married and has no children.    Brief HPI: Patient unable to provide any history with AMS.  Briefly, 81 yo male with terminal bladder CA s/p XRT. Currently under hospice care at assisted living. Presented to ED with altered mental status. Found to be hypotensive, AKI, e-lyte abnormalities, SIRS consistent with MSOF.  Admitted to hospitalist service for ongoing care.  Initially received IV fluids, abx.  Hospice contacted about admission and present to see him today and help address goals.  Family has been told by other physicians that he likely has  weeks or less to live.      PMH:  Past Medical History  Diagnosis Date  . Anemia   . Urinary retention   . CHF (congestive heart failure)   . Pulmonary hypertension   . Seizure   . Hypernatremia   . Aspiration into airway   . Lung injury   . Pneumonitis   . Fall   . Radiation 12/23/13-01/11/14    left pelvis 30 gray     PSH: Past Surgical History  Procedure Laterality Date  . Transurethral resection of bladder tumor N/A 12/18/2013    Procedure: TRANSURETHRAL RESECTION OF BLADDER TUMOR (TURBT);  Surgeon: Festus Aloe, MD;  Location: [facility];  Service: Urology;  Laterality: N/A;   I have reviewed the [employer] and SH and  If appropriate update it with new information. Allergies  Allergen Reactions  . Hydrochlorothiazide Other (See Comments)    Hyponatremia   Scheduled Meds: . albuterol  2.5 mg Nebulization Q6H  . antiseptic oral rinse  7 mL Mouth Rinse q12n4p  . chlorhexidine  15 mL Mouth Rinse BID  . feeding supplement (ENSURE COMPLETE)  237 mL Oral TID BM  . lactulose  30 g Oral BID  . levothyroxine  88 mcg Oral QAC breakfast  . nystatin  1 g Topical Daily  . potassium chloride  10 mEq Intravenous Q1 Hr x 3  . sennosides  5 mL Oral QHS   Continuous Infusions: . dextrose 5 %  with KCl 20 mEq / L 20 mEq (02/28/14 0122)   PRN Meds:.acetaminophen **OR** acetaminophen, albuterol, HYDROmorphone (DILAUDID) injection, LORazepam, ondansetron **OR** ondansetron (ZOFRAN) IV, oxyCODONE    BP 123/53 mmHg  Pulse 64  Temp(Src) 97.5 F (36.4 C) (Axillary)  Resp 16  Ht 5\' 8"  (1.727 m)  Wt 48.3 kg (106 lb 7.7 oz)  BMI 16.19 kg/m2  SpO2 100%   PPS: 20   Intake/Output Summary (Last 24 hours) at 02/28/14 1553 Last data filed at 02/28/14 1042  Gross per 24 hour  Intake 181.67 ml  Output    500 ml  Net -318.33 ml   Physical Exam:  General: Confused, NAD- HEENT:  Dry mm Chest:   Scattered coarse sounds CVS: regular rate Abdomen:mild distension Ext: no edema Neuro:  confused, intermittent following of simple commands  Labs: CBC    Component Value Date/Time   WBC 20.5* 02/28/2014 0600   RBC 4.09* 02/28/2014 0600   HGB 10.4* 02/28/2014 0600   HCT 35.6* 02/28/2014 0600   PLT 301 02/28/2014 0600   MCV 87.0 02/28/2014 0600   MCH 25.4* 02/28/2014 0600   MCHC 29.2* 02/28/2014 0600   RDW 15.4 02/28/2014 0600   LYMPHSABS 1.6 02/27/2014 1909   MONOABS 0.8 02/27/2014 1909   EOSABS 0.8* 02/27/2014 1909   BASOSABS 0.0 02/27/2014 1909    BMET    Component Value Date/Time   NA 159* 02/28/2014 0600   K 2.9* 02/28/2014 0600   CL 117* 02/28/2014 0600   CO2 35* 02/28/2014 0600   GLUCOSE 133* 02/28/2014 0600   BUN 88* 02/28/2014 0600   CREATININE 1.38* 02/28/2014 0600   CALCIUM 13.6* 02/28/2014 0600   GFRNONAA 45* 02/28/2014 0600   GFRAA 52* 02/28/2014 0600    CMP     Component Value Date/Time   NA 159* 02/28/2014 0600   K 2.9* 02/28/2014 0600   CL 117* 02/28/2014 0600   CO2 35* 02/28/2014 0600   GLUCOSE 133* 02/28/2014 0600   BUN 88* 02/28/2014 0600   CREATININE 1.38* 02/28/2014 0600   CALCIUM 13.6* 02/28/2014 0600   PROT 7.9 02/27/2014 1909   ALBUMIN 2.7* 02/27/2014 1909   AST 54* 02/27/2014 1909   ALT 81* 02/27/2014 1909   ALKPHOS 160* 02/27/2014 1909   BILITOT 1.1 02/27/2014 1909   GFRNONAA 45* 02/28/2014 0600   GFRAA 52* 02/28/2014 0600     Total Time 60 minutes Greater than 50%  of this time was spent counseling and coordinating care related to the above assessment and plan.  Doran Clay D.O. Palliative Medicine Team at [facility]  Pager: [phone] Team Phone: [phone]

## 2014-02-28 NOTE — Progress Notes (Signed)
Spoke with brother William Baker briefly over phone.  He is hard of hearing so will continue conversation when he arrives around 1PM today. William Baker has dementia, currently encephalopathic and unable to participate in conversation. Unfortunate that Palliative Care is just getting to know William Baker [facility] for first time this admission.   Doran Clay D.O. Palliative Medicine Team at [facility]  Pager: [phone] Team Phone: [phone]

## 2014-02-28 NOTE — Progress Notes (Signed)
PROGRESS NOTE  William Baker IRC:[id number] DOB: [birth year] DOA: 02/27/2014 PCP: [name], MD  HPI: William Baker is a 81 y.o. 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 He was evaluated and found to have multiple laboratory abnormalities including Hypernatremia, Hypercalcemia and ARF along with hypotension. The EDP spoke with the [facility] who reports being told by the patient's doctors that the patient had less than 2 weeks to live, and they wish for the patient to be a Do Not Resuscitate and be made comfortable and to consult palliative care services.   Subjective / 24 H Interval events Has eyes open, does not interact much   Assessment/Plan: Active Problems:   Malignant neoplasm of urinary bladder   Malnutrition of moderate degree   SIRS (systemic inflammatory response syndrome)   Multisystem organ failure   Metabolic encephalopathy   ARF (acute renal failure)   Hypotension   Hypercalcemia   Hypernatremia   Urinary retention   SIRS (systemic inflammatory response syndrome) -No Specific Source Identified, Has Leukocytosis with Left Shift, and Lactic Acidosis, and hypotension with ARF - continueIV Vancomycin and Cefepime initiated  Multisystem organ failure - Prognosis Poor, Family Told < 2 week Survival   Metabolic encephalopathy - Due to #1, and #2  ARF (acute renal failure) - Due to #2  Hypotension - Due to #1 and #2  Hypercalcemia - Due to malignancy - IVF  Hypernatremia - due to Dehydration -IVFsD5W  Hypokalemia  - IVFs with KCl - 3 runs additional this morning  Leukocytosis  Malignant neoplasm of urinary bladder - Hx of Radiation Rx - terminal   Malnutrition of moderate degree - Ensure as Tolerated TID with meals - If Alert Heart Healthy  Diet  Urinary retention - due to BPH, and Hx Rad Rx on Tamsulosin, and Finasteride - Foley Catheter Placed   Diet: Diet Heart Fluids: D5 with KCl DVT Prophylaxis: heparin  Code Status: DNR Family Communication: none  Disposition Plan: inpatient  Consultants:  Palliative care  Procedures:  None    Antibiotics Vancomycin 1/9 >> Cefepime 1/9 >>   Studies  Ct Abdomen Pelvis Wo Contrast  02/27/2014   CLINICAL DATA:  Sepsis. Possible loss of consciousness. Difficult to arouse. 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  EXAM: CT ABDOMEN AND PELVIS WITHOUT CONTRAST  TECHNIQUE: Multidetector CT imaging of the abdomen and pelvis was performed following the standard protocol without IV contrast.  COMPARISON:  CT abdomen 11 11/08/2013  FINDINGS: Lower chest: Linear nodular thickening at the lung bases not changed. Mild bronchiectasis.  Hepatobiliary: No focal hepatic lesion on this noncontrast exam. Gallbladder is normal.  Pancreas: Pancreas is normal. No ductal dilatation. No pancreatic inflammation.  Spleen: Normal spleen.  Adrenals/urinary tract: Adrenal glands and kidneys are normal. The ureters are normal. There is some calcification along the lobe mental surface of the bladder consists with prior surgery.  Stomach/Bowel: Stomach is not images above the diaphragms. Small bowel is nonobstructed. Moderate volume stool throughout the colon. No obstructing lesion identified. Large volume stool rectum. Rectum is distended to 7 cm by stool ball.  Vascular/Lymphatic: Abdominal risk calcified. No retroperitoneal periportal lymphadenopathy.  Reproductive: Prostate gland is normal.  Musculoskeletal: There is a large lytic expansile lesion within the left iliac wing measuring 9.8 x 4.7  cm which is increased from 4.8 x 2.9 cm on CT of 12/17/2013. Likewise the lytic lesions in the T11 vertebral body is increased image 10, series 301. Lesion measures 2.9 x 2.4 cm new from prior.  Other: No  evidence of abscess or free fluid in the abdomen pelvis.  IMPRESSION: 1. Interval significant increase in volume of expansile lytic metastasis within the left iliac wing and T11 vertebral body. 2. Postsurgical change in the bladder without complication. 3. No evidence of abscess or inflammation. 4. Large hiatal hernia. 5. Large stool ball with the rectum and large volume stool throughout the colon suggests constipation. Consider distal impaction / enema.   Electronically Signed   By: Suzy Bouchard M.D.   On: 02/27/2014 21:21   Dg Chest Port 1 View  02/27/2014   CLINICAL DATA:  Altered mental status, congestive heart failure  EXAM: PORTABLE CHEST - 1 VIEW  COMPARISON:  Chest radiograph 12/17/2013  FINDINGS: Normal cardiac silhouette. Lungs are hyperinflated. There is apical scarring not changed from prior. The patient's chin does obscure the right lung apex. No infiltrate or pneumothorax noted. Chronic rib deformities on the left.  IMPRESSION: Hyperinflated lungs without acute findings.   Electronically Signed   By: Suzy Bouchard M.D.   On: 02/27/2014 19:48    Objective  Filed Vitals:   02/28/14 0000 02/28/14 0042 02/28/14 0137 02/28/14 0552  BP: 108/43 113/45  116/53  Pulse: 61 97  69  Temp:  103.3 F (39.6 C)  97.5 F (36.4 C)  TempSrc:  Axillary  Axillary  Resp: 7 16  20   Height:  5\' 8"  (1.727 m)    Weight:  48.3 kg (106 lb 7.7 oz)    SpO2: 93% 94% 94% 99%    Intake/Output Summary (Last 24 hours) at 02/28/14 0809 Last data filed at 02/28/14 0553  Gross per 24 hour  Intake 181.67 ml  Output    250 ml  Net -68.33 ml   Filed Weights   02/27/14 1900 02/28/14 0042  Weight: 60.782 kg (134 lb) 48.3 kg (106 lb 7.7 oz)   Exam:  General:  NAD   HEENT: no scleral icterus   Cardiovascular: RRR  Respiratory: CTA biL on anterior auscultation  Abdomen: soft, non tender  MSK/Extremities: no clubbing/cyanosis   Skin: no rashes  Neuro: does not follow commands  Data  Reviewed: Basic Metabolic Panel:  Recent Labs Lab 02/27/14 1909  NA 155*  K 3.2*  CL 110  CO2 32  GLUCOSE 130*  BUN 92*  CREATININE 1.79*  CALCIUM >15.0*   Liver Function Tests:  Recent Labs Lab 02/27/14 1909  AST 54*  ALT 81*  ALKPHOS 160*  BILITOT 1.1  PROT 7.9  ALBUMIN 2.7*   CBC:  Recent Labs Lab 02/27/14 1909 02/28/14 0600  WBC 21.0* 20.5*  NEUTROABS 17.7*  --   HGB 13.5 10.4*  HCT 46.4 35.6*  MCV 85.5 87.0  PLT 330 301   Scheduled Meds: . albuterol  2.5 mg Nebulization Q6H  . antiseptic oral rinse  7 mL Mouth Rinse q12n4p  . chlorhexidine  15 mL Mouth Rinse BID  . donepezil  5 mg Oral QHS  . feeding supplement (ENSURE COMPLETE)  237 mL Oral TID BM  . finasteride  5 mg Oral Q breakfast  . folic acid  1 mg Oral Daily  . furosemide  40 mg Oral Q breakfast  . heparin  5,000 Units Subcutaneous 3 times per day  . levothyroxine  88 mcg  Oral QAC breakfast  . multivitamin with minerals  1 tablet Oral Daily  . nystatin  1 g Topical Daily  . thiamine  100 mg Oral Daily   Continuous Infusions: . dextrose 5 % with KCl 20 mEq / L 20 mEq (02/28/14 0122)    Marzetta Board, MD [facility] Pager [phone]. If 7 PM - 7 AM, please contact night-coverage at [email or web address], password [facility] 02/28/2014, 8:09 AM  LOS: 1 day

## 2014-02-28 NOTE — Progress Notes (Signed)
IP visit [facility]m RN  Related admission to hospice diagnosis CA Bladder. Patient is listed as a DNR at [facility]. Spoke with Hinton Dyer and brother coming at 1pm to have a family meeting with Dr. Deitra Mayo, Probation officer doing a joint visit Dr. Deitra Mayo, brother Kemal Amores, and [employer] SW. Patient in bed minimally responsive will answer with one word. Meeting with brother and sister in law Florian Buff they will make a decision by monday morning to go back to [place] with hospice or [employer].   Please call with any hospice concerns [phone] Thanks Ardath Sax RN

## 2014-03-01 DIAGNOSIS — Z515 Encounter for palliative care: Secondary | ICD-10-CM

## 2014-03-01 DIAGNOSIS — R410 Disorientation, unspecified: Secondary | ICD-10-CM

## 2014-03-01 DIAGNOSIS — K5641 Fecal impaction: Secondary | ICD-10-CM | POA: Insufficient documentation

## 2014-03-01 LAB — URINE CULTURE
CULTURE: NO GROWTH
Colony Count: NO GROWTH

## 2014-03-01 MED ORDER — MORPHINE SULFATE (CONCENTRATE) 10 MG /0.5 ML PO SOLN: 5.0000 mg | Freq: Four times a day (QID) | ORAL | Status: DC | PRN

## 2014-03-01 MED ORDER — LORAZEPAM 2 MG/ML PO CONC: 0.5000 mg | Freq: Four times a day (QID) | ORAL | Status: DC | PRN

## 2014-03-01 NOTE — Progress Notes (Signed)
PROGRESS NOTE  William Baker:[id number] DOB: 04-12-1925 DOA: 02/27/2014 PCP: [name], MD  HPI: William Baker is a 81 y.o. 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 He was evaluated and found to have multiple laboratory abnormalities including Hypernatremia, Hypercalcemia and ARF along with hypotension. The EDP spoke with the [facility] who reports being told by the patient's doctors that the patient had less than 2 weeks to live, and they wish for the patient to be a Do Not Resuscitate and be made comfortable and to consult palliative care services.   Subjective / 24 H Interval events Has eyes open, does not interact much   Assessment/Plan: Active Problems:   Malignant neoplasm of urinary bladder   Malnutrition of moderate degree   SIRS (systemic inflammatory response syndrome)   Multisystem organ failure   Metabolic encephalopathy   ARF (acute renal failure)   Hypotension   Hypercalcemia   Hypernatremia   Urinary retention   Palliative care encounter   Acute delirium   Altered mental status   Fecal impaction   Goals of care - patient with SIRS and multisystem organ failure in the setting of metastatic cancer. Palliative care medicine was consulted and followed patient while hospitalized, and after discussing with family decision was made to transition William Baker's care to full comfort and minimize further aggressive interventions. SIRS (systemic inflammatory response syndrome) -No Specific Source Identified, Has Leukocytosis with Left Shift, and Lactic Acidosis, and hypotension with ARF.  Multisystem organ failure - Prognosis Poor Metabolic encephalopathy - Due to #1, and #2 ARF (acute renal failure) - Due to #2 Hypotension - Due to #1 and #2 Hypercalcemia - Due to  malignancy - IVF Hypernatremia - due to Dehydration -IVFsD5W Hypokalemia  Leukocytosis Malignant neoplasm of urinary bladder - Hx of Radiation Rx, metastatic Malnutrition of moderate degree - Ensure as Tolerated TID with meals, diet as tolerated  Urinary retention - due to BPH, and Hx Rad Rx on Tamsulosin, and Finasteride - Foley Catheter Placed  Diet: Diet Heart Fluids: D5 with KCl DVT Prophylaxis: heparin  Code Status: DNR Family Communication: none  Disposition Plan: inpatient  Consultants:  Palliative care  Procedures:  None    Antibiotics Vancomycin 1/9 >> 1/10 Cefepime 1/9 >> 1/10   Studies  Ct Abdomen Pelvis Wo Contrast  02/27/2014   CLINICAL DATA:  Sepsis. Possible loss of consciousness. Difficult to arouse. 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  EXAM: CT ABDOMEN AND PELVIS WITHOUT CONTRAST  TECHNIQUE: Multidetector CT imaging of the abdomen and pelvis was performed following the standard protocol without IV contrast.  COMPARISON:  CT abdomen 11 11/08/2013  FINDINGS: Lower chest: Linear nodular thickening at the lung bases not changed. Mild bronchiectasis.  Hepatobiliary: No focal hepatic lesion on this noncontrast exam. Gallbladder is normal.  Pancreas: Pancreas is normal. No ductal dilatation. No pancreatic inflammation.  Spleen: Normal spleen.  Adrenals/urinary tract: Adrenal glands and kidneys are normal. The ureters are normal. There is some calcification along the lobe mental surface of the bladder consists with prior surgery.  Stomach/Bowel: Stomach is not images above the diaphragms. Small bowel is nonobstructed. Moderate volume stool throughout the colon. No obstructing lesion identified. Large volume stool rectum. Rectum is distended to 7 cm by stool ball.  Vascular/Lymphatic: Abdominal  risk calcified. No retroperitoneal periportal lymphadenopathy.  Reproductive: Prostate gland is normal.  Musculoskeletal: There is a large lytic expansile lesion  within the left iliac wing measuring 9.8 x 4.7 cm which is increased from 4.8 x 2.9 cm on CT of 12/17/2013. Likewise the lytic lesions in the T11 vertebral body is increased image 10, series 301. Lesion measures 2.9 x 2.4 cm new from prior.  Other: No evidence of abscess or free fluid in the abdomen pelvis.  IMPRESSION: 1. Interval significant increase in volume of expansile lytic metastasis within the left iliac wing and T11 vertebral body. 2. Postsurgical change in the bladder without complication. 3. No evidence of abscess or inflammation. 4. Large hiatal hernia. 5. Large stool ball with the rectum and large volume stool throughout the colon suggests constipation. Consider distal impaction / enema.   Electronically Signed   By: Suzy Bouchard M.D.   On: 02/27/2014 21:21   Dg Chest Port 1 View  02/27/2014   CLINICAL DATA:  Altered mental status, congestive heart failure  EXAM: PORTABLE CHEST - 1 VIEW  COMPARISON:  Chest radiograph 12/17/2013  FINDINGS: Normal cardiac silhouette. Lungs are hyperinflated. There is apical scarring not changed from prior. The patient's chin does obscure the right lung apex. No infiltrate or pneumothorax noted. Chronic rib deformities on the left.  IMPRESSION: Hyperinflated lungs without acute findings.   Electronically Signed   By: Suzy Bouchard M.D.   On: 02/27/2014 19:48    Objective  Filed Vitals:   02/28/14 1800 02/28/14 2111 03/01/14 0628 03/01/14 1430  BP: 126/57 122/51 119/52 119/57  Pulse: 73 72 65 72  Temp: 97.5 F (36.4 C) 97.5 F (36.4 C) 97.7 F (36.5 C) 97.7 F (36.5 C)  TempSrc: Axillary Axillary Axillary Axillary  Resp: 16 16 16 16   Height:      Weight:      SpO2: 98% 100% 99% 99%    Intake/Output Summary (Last 24 hours) at 03/01/14 1431 Last data filed at 03/01/14 1112  Gross per 24 hour  Intake    684 ml  Output    925 ml  Net   -241 ml   Filed Weights   02/27/14 1900 02/28/14 0042 02/28/14 1429  Weight: 60.782 kg (134 lb) 48.3 kg  (106 lb 7.7 oz) 51.7 kg (113 lb 15.7 oz)   Exam:  General:  Not responding  HEENT: no scleral icterus   Cardiovascular: RRR  Neuro: does not follow commands  Data Reviewed: Basic Metabolic Panel:  Recent Labs Lab 02/27/14 1909 02/28/14 0600  NA 155* 159*  K 3.2* 2.9*  CL 110 117*  CO2 32 35*  GLUCOSE 130* 133*  BUN 92* 88*  CREATININE 1.79* 1.38*  CALCIUM >15.0* 13.6*   Liver Function Tests:  Recent Labs Lab 02/27/14 1909  AST 54*  ALT 81*  ALKPHOS 160*  BILITOT 1.1  PROT 7.9  ALBUMIN 2.7*   CBC:  Recent Labs Lab 02/27/14 1909 02/28/14 0600  WBC 21.0* 20.5*  NEUTROABS 17.7*  --   HGB 13.5 10.4*  HCT 46.4 35.6*  MCV 85.5 87.0  PLT 330 301   Scheduled Meds: . antiseptic oral rinse  7 mL Mouth Rinse q12n4p  . chlorhexidine  15 mL Mouth Rinse BID  . feeding supplement (ENSURE COMPLETE)  237 mL Oral TID BM  . lactulose  30 g Oral BID  . levothyroxine  88 mcg Oral QAC breakfast  . nystatin  1 g Topical Daily  . sennosides  5  mL Oral QHS   Continuous Infusions: . dextrose 5 % with KCl 20 mEq / L 20 mEq (02/28/14 0122)    Marzetta Board, MD [facility] Pager [phone]. If 7 PM - 7 AM, please contact night-coverage at [email or web address], password [facility] 03/01/2014, 2:31 PM  LOS: 2 days

## 2014-03-01 NOTE — Progress Notes (Signed)
Nutrition Brief Note  Chart reviewed. Pt now transitioning to comfort care.  No further nutrition interventions warranted at this time.  Please re-consult as needed.   Jahmez Bily A. Thoms Barthelemy, RD, LDN, CDE Pager: [phone] After hours Pager: [phone]  

## 2014-03-01 NOTE — Clinical Documentation Improvement (Signed)
PLEASE SPECIFY TYPE AND ACUITY OF CHF: Possible Clinical Conditions?  Chronic Systolic Congestive Heart Failure Chronic Diastolic Congestive Heart Failure Chronic Systolic & Diastolic Congestive Heart Failure Acute Systolic Congestive Heart Failure Acute Diastolic Congestive Heart Failure Acute Systolic & Diastolic Congestive Heart Failure Acute on Chronic Systolic Congestive Heart Failure Acute on Chronic Diastolic Congestive Heart Failure Acute on Chronic Systolic & Diastolic Congestive Heart Failure Other Condition Cannot Clinically Determine  Supporting Information: (As per notes) "Patient does have a past medical history of CHF"  Thank You, Alessandra Grout, RN, BSN, CCDS,Clinical Documentation Specialist:  [phone]  [phone]=[facility] [place]- Health Information Management

## 2014-03-01 NOTE — Clinical Social Work Note (Signed)
Received confirmation that patient will be going to [facility] tomorrow per patient and his brother's request.  CSW had physician sign DNR for patient, DNR in patient's chart.  Plan to discharge to [facility] for hospice tomorrow.  Jones Broom. [place], MSW, [place] 03/01/2014 4:52 PM

## 2014-03-01 NOTE — Discharge Summary (Signed)
Physician Discharge Summary  William Baker [id number]:[id number] DOB: 09-12-1925 DOA: 02/27/2014  PCP: William Poll, MD  Admit date: 02/27/2014 Discharge date: 03/02/2014  Time spent: 45 minutes  Recommendations for Outpatient Follow-up:  1. Follow up with hospice care   Discharge Diagnoses:  Active Problems:   Malignant neoplasm of urinary bladder   Malnutrition of moderate degree   SIRS (systemic inflammatory response syndrome)   Multisystem organ failure   Metabolic encephalopathy   ARF (acute renal failure)   Hypotension   Hypercalcemia   Hypernatremia   Urinary retention   Palliative care encounter   Acute delirium   Altered mental status   Fecal impaction   End of life care  Discharge Condition: guarded, with hospice  Diet recommendation: as tolerated   Filed Weights   02/27/14 1900 02/28/14 0042 02/28/14 1429  Weight: 60.782 kg (134 lb) 48.3 kg (106 lb 7.7 oz) 51.7 kg (113 lb 15.7 oz)    History of present illness:  William Baker is a 81 y.o. male resident of the [facility] with a history of Stage IV Malignancy of the Bladder S/P Radiation Rx no longer on Treatment who was sent to the ED due to increased lethargy and decreased responsiveness over the past 24 hours per family. He was evaluated and found to have multiple laboratory abnormalities including Hypernatremia, Hypercalcemia and ARF along with hypotension. The EDP spoke with the [place] who reports being told by the patient's doctors that the patient had less than 2 weeks to live, and they wish for the patient to be a Do Not Resuscitate and be made comfortable and to consult palliative care services.  Hospital Course:  Goals of care - patient with SIRS and multisystem organ failure in the setting of metastatic cancer. Palliative care medicine was consulted and followed patient while hospitalized, and after discussing with family decision was made to transition William Baker's care to full comfort and minimize  further aggressive interventions. SIRS (systemic inflammatory response syndrome) -No Specific Source Identified, Has Leukocytosis with Left Shift, and Lactic Acidosis, and hypotension with ARF.  Multisystem organ failure  Metabolic encephalopathy  ARF (acute renal failure) Hypotension  Hypercalcemia Hypernatremia  Hypokalemia  Leukocytosis Malignant neoplasm of urinary bladder with metastasis Malnutrition of moderate degree Chronic diastolic heart failure Urinary retention  Procedures:  None    Consultations:  Palliative medicine  Discharge Exam: Filed Vitals:   03/01/14 0628 03/01/14 1430 03/01/14 2112 03/02/14 0436  BP: 119/52 119/57 112/52 114/44  Pulse: 65 72 68 76  Temp: 97.7 F (36.5 C) 97.7 F (36.5 C) 98.4 F (36.9 C) 98.6 F (37 C)  TempSrc: Axillary Axillary Axillary Axillary  Resp: 16 16 14 17   Height:      Weight:      SpO2: 99% 99% 100% 100%   General: minimally responsive  Cardiovascular: RRR Respiratory: no wheezing  Discharge Instructions     Medication List    STOP taking these medications        atorvastatin 10 MG tablet  Commonly known as:  LIPITOR     calcium carbonate 1250 MG tablet  Commonly known as:  OS-CAL - dosed in mg of elemental calcium     cholecalciferol 400 UNITS Tabs tablet  Commonly known as:  VITAMIN D     donepezil 5 MG tablet  Commonly known as:  ARICEPT     folic acid 1 MG tablet  Commonly known as:  FOLVITE     naproxen sodium 220  MG tablet  Commonly known as:  ANAPROX     oxyCODONE 5 MG immediate release tablet  Commonly known as:  Oxy IR/ROXICODONE     phenazopyridine 100 MG tablet  Commonly known as:  PYRIDIUM     SCHOLLS BUNION CUSHIONS Pads      TAKE these medications        acetaminophen 325 MG tablet  Commonly known as:  TYLENOL  Take 650 mg by mouth every 6 (six) hours as needed (for pain).     ENSURE  Take 237 mLs by mouth 3 (three) times daily. Patient drinks Vanilla flavor      finasteride 5 MG tablet  Commonly known as:  PROSCAR  Take 5 mg by mouth daily with breakfast.     furosemide 40 MG tablet  Commonly known as:  LASIX  Take 40 mg by mouth daily with breakfast.     hydrALAZINE 25 MG tablet  Commonly known as:  APRESOLINE  Take 25 mg by mouth 3 (three) times daily.     hyoscyamine 0.125 MG SL tablet  Commonly known as:  LEVSIN SL  Place 2 tablets (0.25 mg total) under the tongue every 4 (four) hours as needed (Bladder spasms/irritation).     levothyroxine 88 MCG tablet  Commonly known as:  SYNTHROID, LEVOTHROID  Take 88 mcg by mouth daily before breakfast.     LORazepam 2 MG/ML concentrated solution  Commonly known as:  ATIVAN  Take 0.3 mLs (0.6 mg total) by mouth every 6 (six) hours as needed for anxiety.     mineral oil liquid  Place 5 mLs into the right ear once a week. On Tuesday's     morphine CONCENTRATE 10 mg / 0.5 ml concentrated solution  Take 0.25 mLs (5 mg total) by mouth every 6 (six) hours as needed for severe pain.     multivitamin with minerals Tabs tablet  Take 1 tablet by mouth daily.     nystatin 100000 UNIT/GM Powd  Apply 1 g topically daily.     polyethylene glycol packet  Commonly known as:  MIRALAX / GLYCOLAX  Take 17 g by mouth daily.     psyllium 0.52 G capsule  Commonly known as:  REGULOID  Take 0.52 g by mouth at bedtime.     tamsulosin 0.4 MG Caps capsule  Commonly known as:  FLOMAX  Take 0.4 mg by mouth daily with breakfast.     thiamine 100 MG tablet  Take 1 tablet (100 mg total) by mouth daily.         The results of significant diagnostics from this hospitalization (including imaging, microbiology, ancillary and laboratory) are listed below for reference.    Significant Diagnostic Studies: Ct Abdomen Pelvis Wo Contrast  02/27/2014   CLINICAL DATA:  Sepsis. Possible loss of consciousness. Difficult to arouse. 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  EXAM: CT ABDOMEN AND PELVIS WITHOUT  CONTRAST  TECHNIQUE: Multidetector CT imaging of the abdomen and pelvis was performed following the standard protocol without IV contrast.  COMPARISON:  CT abdomen 11 11/08/2013  FINDINGS: Lower chest: Linear nodular thickening at the lung bases not changed. Mild bronchiectasis.  Hepatobiliary: No focal hepatic lesion on this noncontrast exam. Gallbladder is normal.  Pancreas: Pancreas is normal. No ductal dilatation. No pancreatic inflammation.  Spleen: Normal spleen.  Adrenals/urinary tract: Adrenal glands and kidneys are normal. The ureters are normal. There is some calcification along the lobe mental surface of the bladder consists with prior surgery.  Stomach/Bowel: Stomach is not images above the diaphragms. Small bowel is nonobstructed. Moderate volume stool throughout the colon. No obstructing lesion identified. Large volume stool rectum. Rectum is distended to 7 cm by stool ball.  Vascular/Lymphatic: Abdominal risk calcified. No retroperitoneal periportal lymphadenopathy.  Reproductive: Prostate gland is normal.  Musculoskeletal: There is a large lytic expansile lesion within the left iliac wing measuring 9.8 x 4.7 cm which is increased from 4.8 x 2.9 cm on CT of 12/17/2013. Likewise the lytic lesions in the T11 vertebral body is increased image 10, series 301. Lesion measures 2.9 x 2.4 cm new from prior.  Other: No evidence of abscess or free fluid in the abdomen pelvis.  IMPRESSION: 1. Interval significant increase in volume of expansile lytic metastasis within the left iliac wing and T11 vertebral body. 2. Postsurgical change in the bladder without complication. 3. No evidence of abscess or inflammation. 4. Large hiatal hernia. 5. Large stool ball with the rectum and large volume stool throughout the colon suggests constipation. Consider distal impaction / enema.   Electronically Signed   By: Suzy Bouchard M.D.   On: 02/27/2014 21:21   Dg Chest Port 1 View  02/27/2014   CLINICAL DATA:  Altered  mental status, congestive heart failure  EXAM: PORTABLE CHEST - 1 VIEW  COMPARISON:  Chest radiograph 12/17/2013  FINDINGS: Normal cardiac silhouette. Lungs are hyperinflated. There is apical scarring not changed from prior. The patient's chin does obscure the right lung apex. No infiltrate or pneumothorax noted. Chronic rib deformities on the left.  IMPRESSION: Hyperinflated lungs without acute findings.   Electronically Signed   By: Suzy Bouchard M.D.   On: 02/27/2014 19:48    Microbiology: Recent Results (from the past 240 hour(s))  Blood Culture (routine x 2)     Status: None (Preliminary result)   Collection Time: 02/27/14  7:09 PM  Result Value Ref Range Status   Specimen Description BLOOD RIGHT HAND  Final   Special Requests BOTTLES DRAWN AEROBIC ONLY 4CC  Final   Culture   Final           BLOOD CULTURE RECEIVED NO GROWTH TO DATE CULTURE WILL BE HELD FOR 5 DAYS BEFORE ISSUING A FINAL NEGATIVE REPORT Performed at [facility]    Report Status PENDING  Incomplete  Blood Culture (routine x 2)     Status: None (Preliminary result)   Collection Time: 02/27/14  7:14 PM  Result Value Ref Range Status   Specimen Description BLOOD RIGHT ANTECUBITAL  Final   Special Requests BOTTLES DRAWN AEROBIC AND ANAEROBIC 5CC EA  Final   Culture   Final           BLOOD CULTURE RECEIVED NO GROWTH TO DATE CULTURE WILL BE HELD FOR 5 DAYS BEFORE ISSUING A FINAL NEGATIVE REPORT Performed at [facility]    Report Status PENDING  Incomplete  Urine culture     Status: None   Collection Time: 02/27/14  9:41 PM  Result Value Ref Range Status   Specimen Description URINE, CATHETERIZED  Final   Special Requests NONE  Final   Colony Count NO GROWTH Performed at [facility]   Final   Culture NO GROWTH Performed at [facility]   Final   Report Status 03/01/2014 FINAL  Final  MRSA PCR Screening     Status: None   Collection Time: 02/28/14  8:53 AM  Result Value Ref Range  Status   MRSA by PCR NEGATIVE NEGATIVE  Final    Comment:        The GeneXpert MRSA Assay (FDA approved for NASAL specimens only), is one component of a comprehensive MRSA colonization surveillance program. It is not intended to diagnose MRSA infection nor to guide or monitor treatment for MRSA infections.      Labs: Basic Metabolic Panel:  Recent Labs Lab 02/27/14 1909 02/28/14 0600  NA 155* 159*  K 3.2* 2.9*  CL 110 117*  CO2 32 35*  GLUCOSE 130* 133*  BUN 92* 88*  CREATININE 1.79* 1.38*  CALCIUM >15.0* 13.6*   Liver Function Tests:  Recent Labs Lab 02/27/14 1909  AST 54*  ALT 81*  ALKPHOS 160*  BILITOT 1.1  PROT 7.9  ALBUMIN 2.7*   CBC:  Recent Labs Lab 02/27/14 1909 02/28/14 0600  WBC 21.0* 20.5*  NEUTROABS 17.7*  --   HGB 13.5 10.4*  HCT 46.4 35.6*  MCV 85.5 87.0  PLT 330 301    Signed:  HERNANDEZ ACOSTA,ESTELA  [facility] 03/02/2014, 11:15 AM

## 2014-03-01 NOTE — Progress Notes (Signed)
Patient XI:HWTUUE C Orr      DOB: 08/24/1925      KCM:[id number]   Palliative Medicine Team at [facility] Progress Note    Subjective: Confused, unable to have purposeful conversation with me.     Filed Vitals:   03/01/14 0628  BP: 119/52  Pulse: 65  Temp: 97.7 F (36.5 C)  Resp: 16   Physical exam: GEN: alert, pleasently confused CV: Regular rate ABD: soft, mild diffuse tenderness EXT: no edema, warm  CBC    Component Value Date/Time   WBC 20.5* 02/28/2014 0600   RBC 4.09* 02/28/2014 0600   HGB 10.4* 02/28/2014 0600   HCT 35.6* 02/28/2014 0600   PLT 301 02/28/2014 0600   MCV 87.0 02/28/2014 0600   MCH 25.4* 02/28/2014 0600   MCHC 29.2* 02/28/2014 0600   RDW 15.4 02/28/2014 0600   LYMPHSABS 1.6 02/27/2014 1909   MONOABS 0.8 02/27/2014 1909   EOSABS 0.8* 02/27/2014 1909   BASOSABS 0.0 02/27/2014 1909    CMP     Component Value Date/Time   NA 159* 02/28/2014 0600   K 2.9* 02/28/2014 0600   CL 117* 02/28/2014 0600   CO2 35* 02/28/2014 0600   GLUCOSE 133* 02/28/2014 0600   BUN 88* 02/28/2014 0600   CREATININE 1.38* 02/28/2014 0600   CALCIUM 13.6* 02/28/2014 0600   PROT 7.9 02/27/2014 1909   ALBUMIN 2.7* 02/27/2014 1909   AST 54* 02/27/2014 1909   ALT 81* 02/27/2014 1909   ALKPHOS 160* 02/27/2014 1909   BILITOT 1.1 02/27/2014 1909   GFRNONAA 45* 02/28/2014 0600   GFRAA 52* 02/28/2014 0600       Assessment and plan: 81 yo male with metastatic bladder CA who presented with AMS, MSOF, malignant hypercalcemia on dying trajectory from bladder CA.   1. Code Status: DNR  2. Goals of Care: Comfort/Hospice Care. See initial consult. i spoke with brother Wille Glaser again today. He has been in touch with hospice.  He will see his brother this afternoon and make decision about whether to go [facility] or ALF with hospice care.  This can all be handled by hospice agency.    3. Symptom Management:  1. Pain: Agree with PRN dilaudid given his  AKI. 2. Constipation/Fecal impaction:  Still has not responded to oral laxatives. Will order disimpaction attempt x1 because he seems to have some more abdominal tenderness today.  I would not progress to enema with goal of comfort.  It may improve symptoms some if stool can easily be removed.   3. Delirium: 2/2 malignant hypercalcemia. PRN ativan ordered.  4. Psychosocial/Spiritual: [facility] patient living at ALF. Had several friends there. HCPOA Wille Glaser is only family. Mr Klima was never married and has no children.   Doran Clay D.O. Palliative Medicine Team at [facility]  Pager: [phone] Team Phone: [phone]

## 2014-03-01 NOTE — Progress Notes (Signed)
Inpatient RN visit-[name]  [facility] 6N room 15 -[facility] RN Visit-[name] RN. Patient  Sent to [facility] via EMS by facility staff on 02/27/2014 for decreased LOC, low BP, difficulty rousing. Patient admitted for treatment of SIRS, Hypernatremia, Hypercalcemia and ARF,was a full code on arrival to ED, family changed to DNR per chart notes.  Related admission to [facility] diagnosis of Bladder Ca.  Pt is DNR  Code effective this hospitalization.    Patient seen at bedside, brother Wille Glaser present for visit. Joe related that he had attempted to talk with Lissa Hoard regarding his choice of returning to [place] or going to [facility] for EOL care. After several attempts, Joe felt satisfied that his brother was agreeable to going to [facility]. Patient remained minimally responsive. Staff aide Tonya in during visit to given mouth care, she reported that he took a few bites this am. He has had a bowel movement in response to the lactulose given. He is currently recvg IVF at 50 ml/hr. [facility] LCSW Rolm Bookbinder notified of Joe's choice for transfer to [facility] when bed available. Joe educated on care provided at [facility] place per his questions. Writer advsd Joe that [facility] SW would be meeting him at [facility] after patient was transferred, also explained that hospital SW Randall Hiss would arrange transport via EMS and that hospital staff would notify him with time of transfer. Joe expressed understanding. Patient's home medication list, and transfer summary in place on shadow chart.  Please call [facility] @ [phone] with any hospice needs.   Thank you. Tracey Harries, RN  [facility]  Hospice Liaison  [phone])

## 2014-03-02 MED ORDER — MORPHINE SULFATE (CONCENTRATE) 10 MG /0.5 ML PO SOLN: 5.0000 mg | Freq: Four times a day (QID) | ORAL | Status: AC | PRN

## 2014-03-02 MED ORDER — LORAZEPAM 2 MG/ML PO CONC: 0.5000 mg | Freq: Four times a day (QID) | ORAL | Status: AC | PRN

## 2014-03-02 NOTE — Progress Notes (Signed)
Inpatient RN visit- [name] "William Baker [facility] 6N Room 15 -[facility] RN Visit-William Alford [place] RN  Related admission to [facility] diagnosis of Bladder Ca. Pt is now DNR  code.   Patient seen at bedside, eyes closed, appeared to be sleeping, roused very briefly to voice and gentle touch. Breakfast tray at bed side, patient did not eat, despite staff attempts to feed him. IVF continues at 43ml/hr, patient recvg only medications related to comfort at this time, IV abt have been d/cd. Foley draining clear golden urine.  Plan is for patient to transfer today to [facility] via EMS for EOL care. Staff RN please  call report to [name] @ [phone]. No family present at time of visit, writer and [facility] LCSW have spoken to patient's brother who is in agreement with discharge plan. Awaiting physician discharge summary per staff CSW [name].  Patient's home medication list and transfer summary is in place on shadow chart.  Please call [facility] @ [phone]-with any hospice needs.   Thank you. Tracey Harries, RN, BSN,  [place] Liaison  [phone])

## 2014-03-02 NOTE — Care Management Note (Signed)
03-02-14 Important message from Medicare given . Magdalen Spatz RN BSN

## 2014-03-02 NOTE — Clinical Social Work Note (Signed)
Patient to be d/c'ed today to [facility] for residential hospice.  Patient and family agreeable to plans will transport via ems RN to call report.  Evette Cristal, MSW, [place]

## 2014-03-02 NOTE — Progress Notes (Signed)
Report called to [facility].  

## 2014-03-06 LAB — CULTURE, BLOOD (ROUTINE X 2)
Culture: NO GROWTH
Culture: NO GROWTH

## 2014-03-11 ENCOUNTER — Ambulatory Visit: Payer: Medicare HMO | Admitting: Radiation Oncology

## 2014-03-22 DEATH — deceased

## 2014-04-01 ENCOUNTER — Ambulatory Visit: Payer: Medicare HMO | Admitting: Oncology

## 2015-01-23 IMAGING — CT CT ABD-PELV W/O CM
2 of 7 series · 12 of 46 positions shown, 14 images · non-contrast
Comparison: CT abdomen [DATE] 11/08/2013

CLINICAL DATA: Sepsis. Possible loss of consciousness. Difficult to
arouse. Bladder cancer with left pelvic skeletal metastasis.

EXAM:
CT ABDOMEN AND PELVIS WITHOUT CONTRAST
TECHNIQUE: Multidetector CT imaging of the abdomen and pelvis was performed
following the standard protocol without IV contrast.

[Series 201: routine, idose (2) · axial · 0.78mm/px · z∈[-247,+28]mm · 9 of 69 slices shown, 11 images]
[im 7/69  soft-tissue]
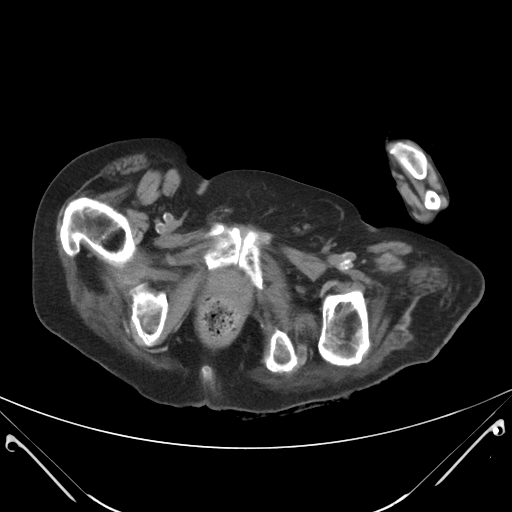
[im 7/69  bone]
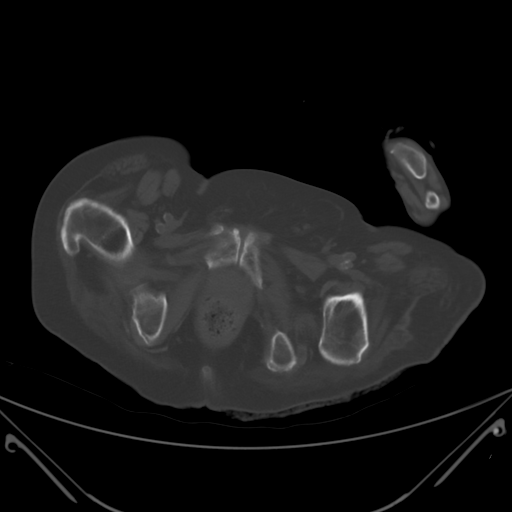
[im 14/69  soft-tissue]
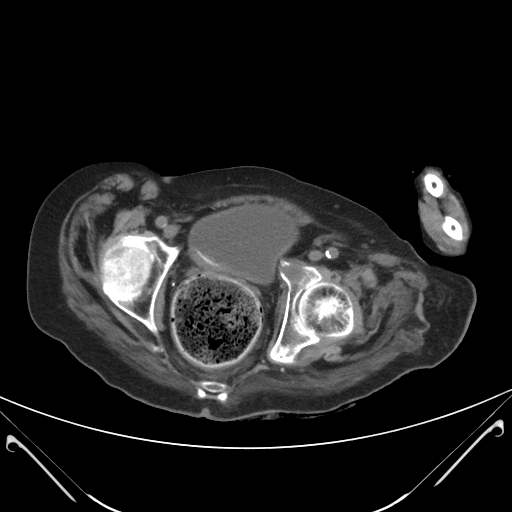
[im 21/69  soft-tissue]
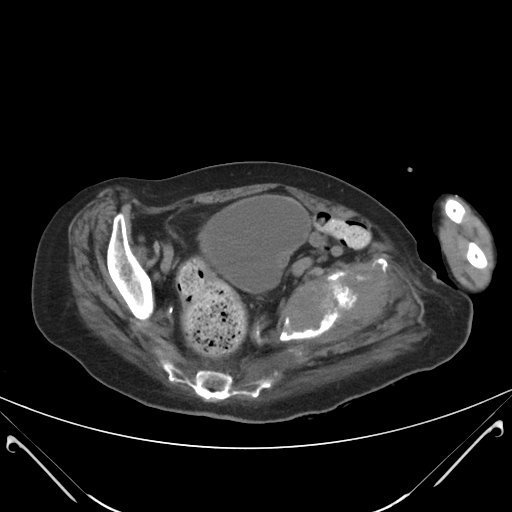
[im 28/69  soft-tissue]
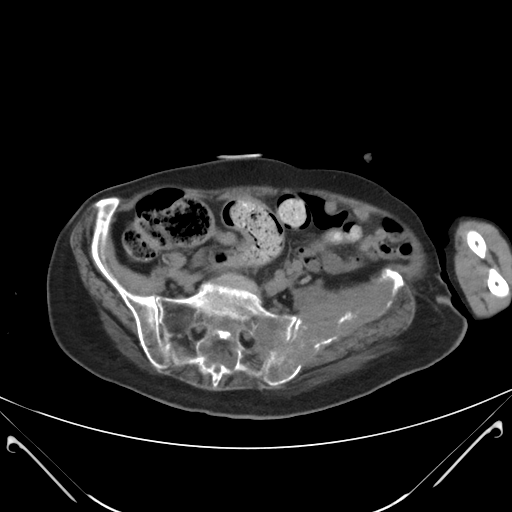
[im 35/69  soft-tissue]
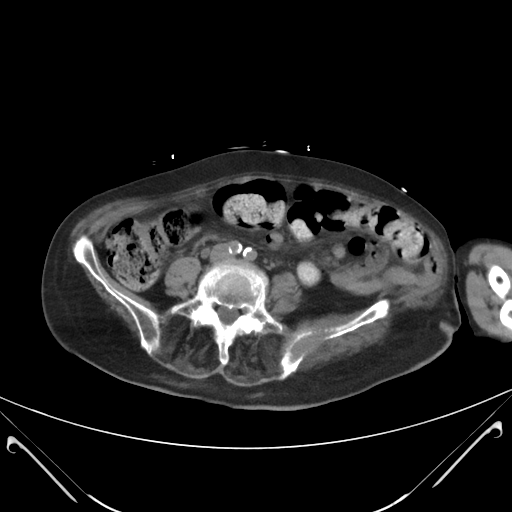
[im 41/69  soft-tissue]
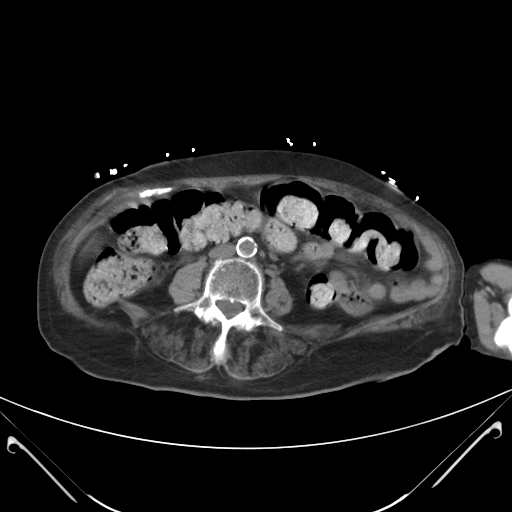
[im 48/69  soft-tissue]
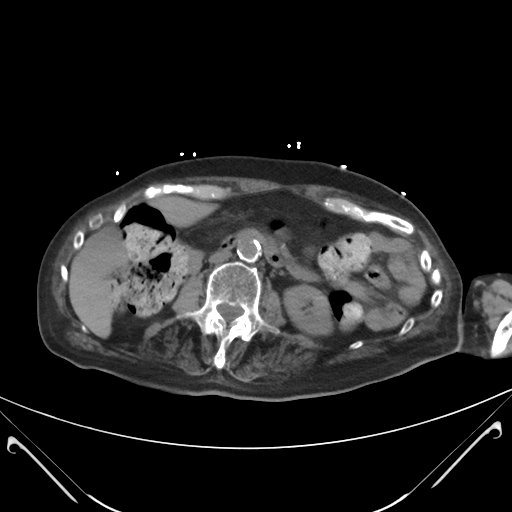
[im 55/69  soft-tissue]
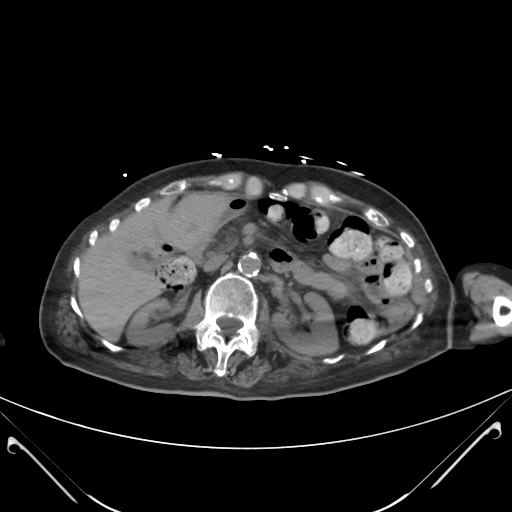
[im 62/69  soft-tissue]
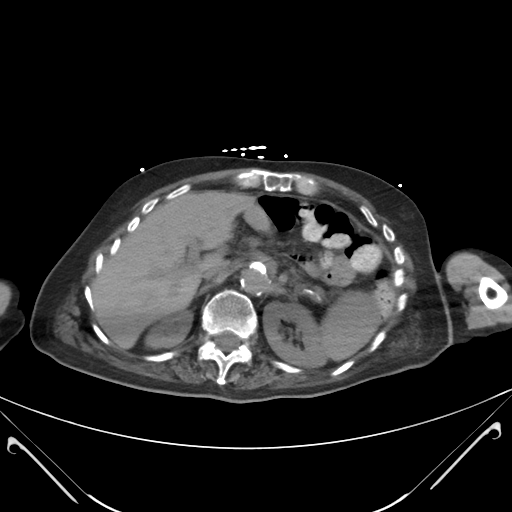
[im 62/69  bone]
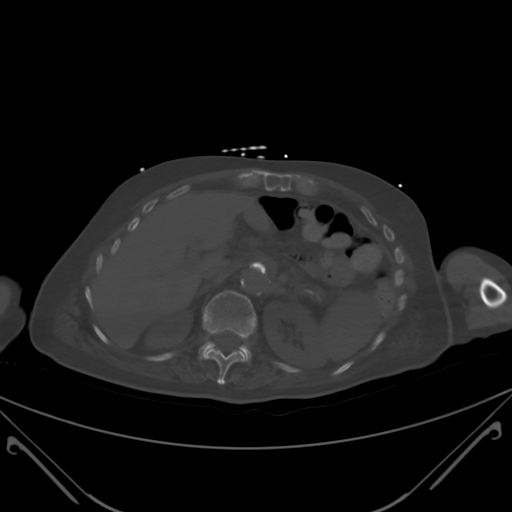

[Series 202: coronals, idose (2) · coronal · 0.45mm/px · 3 of 77 slices shown]
[im 20/77  soft-tissue]
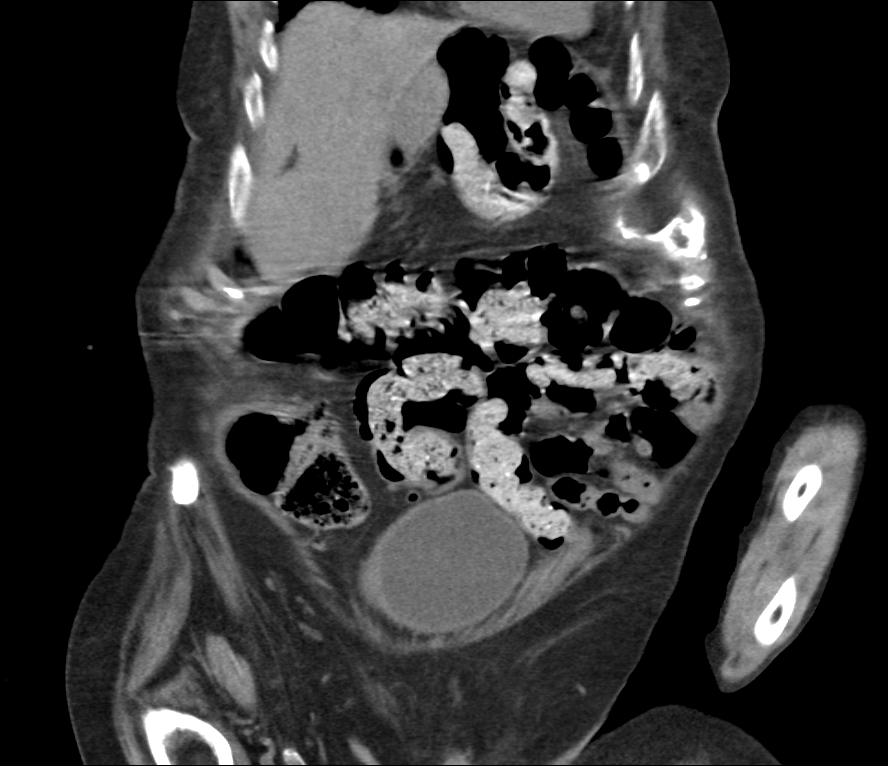
[im 39/77  soft-tissue]
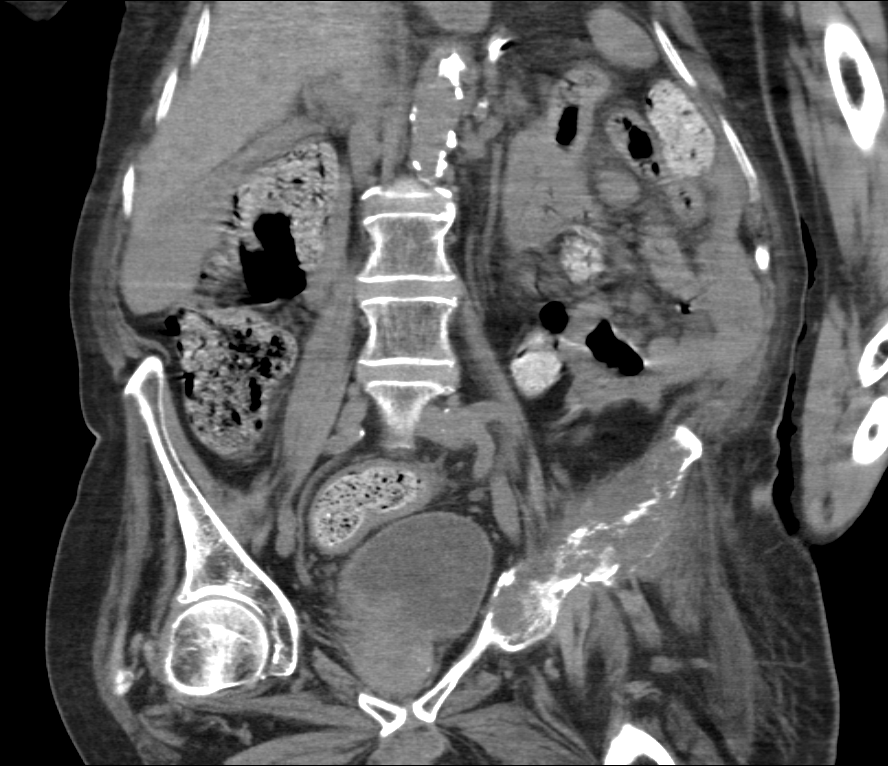
[im 58/77  soft-tissue]
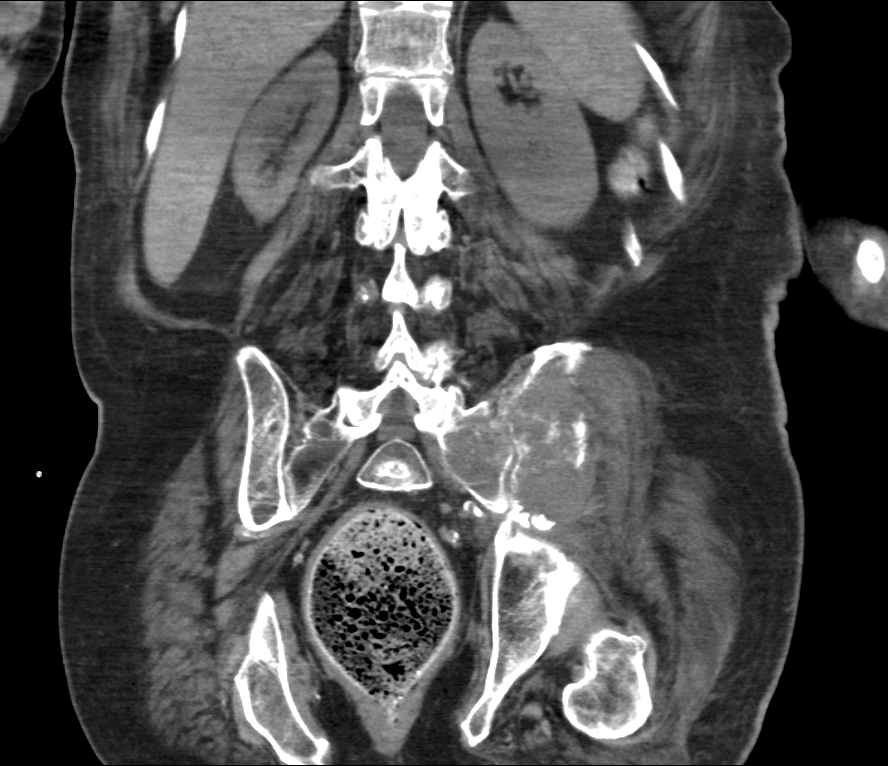

[12 of 46 positions shown; findings below may reference images not displayed]

FINDINGS: Lower chest: Linear nodular thickening at the lung bases not
changed. Mild bronchiectasis.

Hepatobiliary: No focal hepatic lesion on this noncontrast exam.
Gallbladder is normal.

Pancreas: Pancreas is normal. No ductal dilatation. No pancreatic
inflammation.

Spleen: Normal spleen.

Adrenals/urinary tract: Adrenal glands and kidneys are normal. The
ureters are normal. There is some calcification along the lobe
mental surface of the bladder consists with prior surgery.

Stomach/Bowel: Stomach is not images above the diaphragms. Small
bowel is nonobstructed. Moderate volume stool throughout the colon.
No obstructing lesion identified. Large volume stool rectum. Rectum
is distended to 7 cm by stool ball.

Vascular/Lymphatic: Abdominal risk calcified. No retroperitoneal
periportal lymphadenopathy.

Reproductive: Prostate gland is normal.

Musculoskeletal: There is a large lytic expansile lesion within the
left iliac wing measuring 9.8 x 4.7 cm which is increased from 4.8 x
2.9 cm on CT of 12/17/2013. Likewise the lytic lesions in the T11
vertebral body is increased image 10, series 301. Lesion measures
2.9 x 2.4 cm new from prior.

Other: No evidence of abscess or free fluid in the abdomen pelvis.
IMPRESSION: 1. Interval significant increase in volume of expansile lytic
metastasis within the left iliac wing and T11 vertebral body.
2. Postsurgical change in the bladder without complication.
3. No evidence of abscess or inflammation.
4. Large hiatal hernia.
5. Large stool ball with the rectum and large volume stool
throughout the colon suggests constipation. Consider distal
impaction / enema.
